# Patient Record
Sex: Female | Born: 1935 | Race: White | Hispanic: No | Smoking: Never smoker
Health system: Southern US, Community
[De-identification: ages and names within clinical notes are randomized; demographics above are authoritative.]

## PROBLEM LIST (undated history)

## (undated) DIAGNOSIS — S7290XA Unspecified fracture of unspecified femur, initial encounter for closed fracture: Secondary | ICD-10-CM

## (undated) DIAGNOSIS — F0390 Unspecified dementia without behavioral disturbance: Secondary | ICD-10-CM

## (undated) DIAGNOSIS — F419 Anxiety disorder, unspecified: Secondary | ICD-10-CM

## (undated) DIAGNOSIS — M1990 Unspecified osteoarthritis, unspecified site: Secondary | ICD-10-CM

## (undated) DIAGNOSIS — S5292XA Unspecified fracture of left forearm, initial encounter for closed fracture: Secondary | ICD-10-CM

## (undated) DIAGNOSIS — G4700 Insomnia, unspecified: Secondary | ICD-10-CM

## (undated) HISTORY — PX: CATARACT EXTRACTION: SUR2

---

## 2014-09-19 DIAGNOSIS — Z681 Body mass index (BMI) 19 or less, adult: Secondary | ICD-10-CM | POA: Diagnosis not present

## 2014-09-19 DIAGNOSIS — Z1389 Encounter for screening for other disorder: Secondary | ICD-10-CM | POA: Diagnosis not present

## 2014-09-19 DIAGNOSIS — F418 Other specified anxiety disorders: Secondary | ICD-10-CM | POA: Diagnosis not present

## 2014-09-19 DIAGNOSIS — R413 Other amnesia: Secondary | ICD-10-CM | POA: Diagnosis not present

## 2014-11-05 DIAGNOSIS — R634 Abnormal weight loss: Secondary | ICD-10-CM | POA: Diagnosis not present

## 2014-11-05 DIAGNOSIS — F419 Anxiety disorder, unspecified: Secondary | ICD-10-CM | POA: Diagnosis not present

## 2014-11-05 DIAGNOSIS — Z681 Body mass index (BMI) 19 or less, adult: Secondary | ICD-10-CM | POA: Diagnosis not present

## 2014-11-05 DIAGNOSIS — R413 Other amnesia: Secondary | ICD-10-CM | POA: Diagnosis not present

## 2015-04-09 DIAGNOSIS — Z0000 Encounter for general adult medical examination without abnormal findings: Secondary | ICD-10-CM | POA: Diagnosis not present

## 2015-04-09 DIAGNOSIS — R413 Other amnesia: Secondary | ICD-10-CM | POA: Diagnosis not present

## 2015-04-16 DIAGNOSIS — Z23 Encounter for immunization: Secondary | ICD-10-CM | POA: Diagnosis not present

## 2015-04-16 DIAGNOSIS — Z0000 Encounter for general adult medical examination without abnormal findings: Secondary | ICD-10-CM | POA: Diagnosis not present

## 2015-04-16 DIAGNOSIS — R8290 Unspecified abnormal findings in urine: Secondary | ICD-10-CM | POA: Diagnosis not present

## 2015-04-16 DIAGNOSIS — Z1389 Encounter for screening for other disorder: Secondary | ICD-10-CM | POA: Diagnosis not present

## 2015-04-16 DIAGNOSIS — F0390 Unspecified dementia without behavioral disturbance: Secondary | ICD-10-CM | POA: Diagnosis not present

## 2015-04-16 DIAGNOSIS — E43 Unspecified severe protein-calorie malnutrition: Secondary | ICD-10-CM | POA: Diagnosis not present

## 2015-04-16 DIAGNOSIS — F419 Anxiety disorder, unspecified: Secondary | ICD-10-CM | POA: Diagnosis not present

## 2015-09-30 DIAGNOSIS — S7290XA Unspecified fracture of unspecified femur, initial encounter for closed fracture: Secondary | ICD-10-CM

## 2015-09-30 DIAGNOSIS — S5292XA Unspecified fracture of left forearm, initial encounter for closed fracture: Secondary | ICD-10-CM

## 2015-09-30 HISTORY — DX: Unspecified fracture of unspecified femur, initial encounter for closed fracture: S72.90XA

## 2015-09-30 HISTORY — DX: Unspecified fracture of left forearm, initial encounter for closed fracture: S52.92XA

## 2015-10-01 ENCOUNTER — Emergency Department (HOSPITAL_COMMUNITY): Payer: Medicare Other

## 2015-10-01 ENCOUNTER — Inpatient Hospital Stay (HOSPITAL_COMMUNITY)
Admission: EM | Admit: 2015-10-01 | Discharge: 2015-10-07 | DRG: 480 | Disposition: A | Discharge status: Skilled Nursing Facility | Payer: Medicare Other | Attending: Family Medicine | Admitting: Family Medicine

## 2015-10-01 ENCOUNTER — Encounter (HOSPITAL_COMMUNITY): Payer: Self-pay | Admitting: Emergency Medicine

## 2015-10-01 ENCOUNTER — Inpatient Hospital Stay (HOSPITAL_COMMUNITY): Payer: Medicare Other

## 2015-10-01 DIAGNOSIS — S72002A Fracture of unspecified part of neck of left femur, initial encounter for closed fracture: Secondary | ICD-10-CM | POA: Diagnosis present

## 2015-10-01 DIAGNOSIS — D649 Anemia, unspecified: Secondary | ICD-10-CM | POA: Diagnosis not present

## 2015-10-01 DIAGNOSIS — S0990XA Unspecified injury of head, initial encounter: Secondary | ICD-10-CM | POA: Diagnosis not present

## 2015-10-01 DIAGNOSIS — R9431 Abnormal electrocardiogram [ECG] [EKG]: Secondary | ICD-10-CM | POA: Diagnosis not present

## 2015-10-01 DIAGNOSIS — S5292XA Unspecified fracture of left forearm, initial encounter for closed fracture: Secondary | ICD-10-CM | POA: Diagnosis present

## 2015-10-01 DIAGNOSIS — L89151 Pressure ulcer of sacral region, stage 1: Secondary | ICD-10-CM | POA: Diagnosis not present

## 2015-10-01 DIAGNOSIS — Z515 Encounter for palliative care: Secondary | ICD-10-CM | POA: Insufficient documentation

## 2015-10-01 DIAGNOSIS — R739 Hyperglycemia, unspecified: Secondary | ICD-10-CM | POA: Diagnosis present

## 2015-10-01 DIAGNOSIS — S40022A Contusion of left upper arm, initial encounter: Secondary | ICD-10-CM | POA: Diagnosis not present

## 2015-10-01 DIAGNOSIS — D6489 Other specified anemias: Secondary | ICD-10-CM | POA: Diagnosis not present

## 2015-10-01 DIAGNOSIS — M8580 Other specified disorders of bone density and structure, unspecified site: Secondary | ICD-10-CM | POA: Diagnosis present

## 2015-10-01 DIAGNOSIS — Y93K1 Activity, walking an animal: Secondary | ICD-10-CM | POA: Diagnosis not present

## 2015-10-01 DIAGNOSIS — S52572A Other intraarticular fracture of lower end of left radius, initial encounter for closed fracture: Secondary | ICD-10-CM | POA: Diagnosis not present

## 2015-10-01 DIAGNOSIS — E559 Vitamin D deficiency, unspecified: Secondary | ICD-10-CM | POA: Diagnosis present

## 2015-10-01 DIAGNOSIS — D72829 Elevated white blood cell count, unspecified: Secondary | ICD-10-CM | POA: Diagnosis present

## 2015-10-01 DIAGNOSIS — S72009A Fracture of unspecified part of neck of unspecified femur, initial encounter for closed fracture: Secondary | ICD-10-CM | POA: Diagnosis not present

## 2015-10-01 DIAGNOSIS — S72132A Displaced apophyseal fracture of left femur, initial encounter for closed fracture: Secondary | ICD-10-CM | POA: Diagnosis not present

## 2015-10-01 DIAGNOSIS — M25552 Pain in left hip: Secondary | ICD-10-CM | POA: Diagnosis not present

## 2015-10-01 DIAGNOSIS — Z419 Encounter for procedure for purposes other than remedying health state, unspecified: Secondary | ICD-10-CM

## 2015-10-01 DIAGNOSIS — S52562A Barton's fracture of left radius, initial encounter for closed fracture: Secondary | ICD-10-CM | POA: Diagnosis not present

## 2015-10-01 DIAGNOSIS — L8990 Pressure ulcer of unspecified site, unspecified stage: Secondary | ICD-10-CM | POA: Insufficient documentation

## 2015-10-01 DIAGNOSIS — S72142A Displaced intertrochanteric fracture of left femur, initial encounter for closed fracture: Secondary | ICD-10-CM | POA: Diagnosis not present

## 2015-10-01 DIAGNOSIS — E876 Hypokalemia: Secondary | ICD-10-CM | POA: Diagnosis not present

## 2015-10-01 DIAGNOSIS — W541XXA Struck by dog, initial encounter: Secondary | ICD-10-CM | POA: Diagnosis not present

## 2015-10-01 DIAGNOSIS — S52512A Displaced fracture of left radial styloid process, initial encounter for closed fracture: Secondary | ICD-10-CM | POA: Diagnosis not present

## 2015-10-01 DIAGNOSIS — Z66 Do not resuscitate: Secondary | ICD-10-CM | POA: Diagnosis present

## 2015-10-01 DIAGNOSIS — Z7189 Other specified counseling: Secondary | ICD-10-CM | POA: Diagnosis not present

## 2015-10-01 DIAGNOSIS — Z01810 Encounter for preprocedural cardiovascular examination: Secondary | ICD-10-CM

## 2015-10-01 DIAGNOSIS — S52502A Unspecified fracture of the lower end of left radius, initial encounter for closed fracture: Secondary | ICD-10-CM | POA: Diagnosis not present

## 2015-10-01 DIAGNOSIS — G308 Other Alzheimer's disease: Secondary | ICD-10-CM | POA: Diagnosis not present

## 2015-10-01 DIAGNOSIS — E43 Unspecified severe protein-calorie malnutrition: Secondary | ICD-10-CM | POA: Diagnosis present

## 2015-10-01 DIAGNOSIS — S59292A Other physeal fracture of lower end of radius, left arm, initial encounter for closed fracture: Secondary | ICD-10-CM | POA: Diagnosis not present

## 2015-10-01 DIAGNOSIS — E46 Unspecified protein-calorie malnutrition: Secondary | ICD-10-CM | POA: Diagnosis not present

## 2015-10-01 DIAGNOSIS — F0390 Unspecified dementia without behavioral disturbance: Secondary | ICD-10-CM | POA: Diagnosis not present

## 2015-10-01 DIAGNOSIS — W010XXA Fall on same level from slipping, tripping and stumbling without subsequent striking against object, initial encounter: Secondary | ICD-10-CM | POA: Diagnosis present

## 2015-10-01 DIAGNOSIS — S52532A Colles' fracture of left radius, initial encounter for closed fracture: Secondary | ICD-10-CM | POA: Diagnosis not present

## 2015-10-01 DIAGNOSIS — Z681 Body mass index (BMI) 19 or less, adult: Secondary | ICD-10-CM

## 2015-10-01 DIAGNOSIS — D62 Acute posthemorrhagic anemia: Secondary | ICD-10-CM | POA: Insufficient documentation

## 2015-10-01 DIAGNOSIS — M84758D Complete oblique atypical femoral fracture, left leg, subsequent encounter for fracture with routine healing: Secondary | ICD-10-CM | POA: Diagnosis not present

## 2015-10-01 DIAGNOSIS — S299XXA Unspecified injury of thorax, initial encounter: Secondary | ICD-10-CM | POA: Diagnosis not present

## 2015-10-01 HISTORY — DX: Unspecified osteoarthritis, unspecified site: M19.90

## 2015-10-01 HISTORY — DX: Unspecified fracture of left forearm, initial encounter for closed fracture: S52.92XA

## 2015-10-01 HISTORY — DX: Unspecified dementia, unspecified severity, without behavioral disturbance, psychotic disturbance, mood disturbance, and anxiety: F03.90

## 2015-10-01 HISTORY — DX: Insomnia, unspecified: G47.00

## 2015-10-01 HISTORY — DX: Anxiety disorder, unspecified: F41.9

## 2015-10-01 HISTORY — DX: Unspecified fracture of unspecified femur, initial encounter for closed fracture: S72.90XA

## 2015-10-01 LAB — BASIC METABOLIC PANEL
ANION GAP: 11 (ref 5–15)
BUN: 19 mg/dL (ref 6–20)
CO2: 25 mmol/L (ref 22–32)
Calcium: 8.4 mg/dL — ABNORMAL LOW (ref 8.9–10.3)
Chloride: 102 mmol/L (ref 101–111)
Creatinine, Ser: 0.52 mg/dL (ref 0.44–1.00)
GFR calc Af Amer: >60 mL/min (ref >60)
GFR calc non Af Amer: >60 mL/min (ref >60)
GLUCOSE: 129 mg/dL — AB (ref 65–99)
Potassium: 4.2 mmol/L (ref 3.5–5.1)
Sodium: 138 mmol/L (ref 135–145)

## 2015-10-01 LAB — CBC
HEMATOCRIT: 33.7 % — AB (ref 36.0–46.0)
Hemoglobin: 10.1 g/dL — ABNORMAL LOW (ref 12.0–15.0)
MCH: 26.5 pg (ref 26.0–34.0)
MCHC: 30 g/dL (ref 30.0–36.0)
MCV: 88.5 fL (ref 78.0–100.0)
Platelets: 368 10*3/uL (ref 150–400)
RBC: 3.81 MIL/uL — AB (ref 3.87–5.11)
RDW: 17.8 % — ABNORMAL HIGH (ref 11.5–15.5)
WBC: 18.9 10*3/uL — AB (ref 4.0–10.5)

## 2015-10-01 LAB — PROTIME-INR
INR: 1.15 (ref 0.00–1.49)
Prothrombin Time: 14.9 seconds (ref 11.6–15.2)

## 2015-10-01 LAB — ABO/RH: ABO/RH(D): A POS

## 2015-10-01 LAB — I-STAT CHEM 8, ED
BUN: 20 mg/dL (ref 6–20)
CHLORIDE: 101 mmol/L (ref 101–111)
Calcium, Ion: 0.98 mmol/L — ABNORMAL LOW (ref 1.12–1.23)
Creatinine, Ser: 0.5 mg/dL (ref 0.44–1.00)
GLUCOSE: 126 mg/dL — AB (ref 65–99)
HCT: 35 % — ABNORMAL LOW (ref 36.0–46.0)
Hemoglobin: 11.9 g/dL — ABNORMAL LOW (ref 12.0–15.0)
POTASSIUM: 4.2 mmol/L (ref 3.5–5.1)
Sodium: 138 mmol/L (ref 135–145)
TCO2: 25 mmol/L (ref 0–100)

## 2015-10-01 LAB — MRSA PCR SCREENING: MRSA BY PCR: NEGATIVE

## 2015-10-01 LAB — CBC AND DIFFERENTIAL
HCT: 34 % — AB (ref 36–46)
HEMOGLOBIN: 10.1 g/dL — AB (ref 12.0–16.0)
PLATELETS: 368 10*3/uL (ref 150–399)
WBC: 18.9 10*3/mL

## 2015-10-01 LAB — ALBUMIN: Albumin: 3.5 g/dL (ref 3.5–5.0)

## 2015-10-01 LAB — CALCIUM: CALCIUM: 8.2 mg/dL — AB (ref 8.9–10.3)

## 2015-10-01 MED ORDER — VITAMIN A 10000 UNITS PO CAPS: 10000.0000 [IU] | ORAL_CAPSULE | Freq: Every day | ORAL | Status: DC

## 2015-10-01 MED ORDER — HEPARIN SODIUM (PORCINE) 5000 UNIT/ML IJ SOLN
5000.0000 [IU] | Freq: Three times a day (TID) | INTRAMUSCULAR | Status: DC
  Administered 2015-10-01 – 2015-10-07 (×16): 5000 [IU] via SUBCUTANEOUS
  Filled 2015-10-01 (×14): qty 1

## 2015-10-01 MED ORDER — FERROUS SULFATE 325 (65 FE) MG PO TABS
325.0000 mg | ORAL_TABLET | Freq: Three times a day (TID) | ORAL | Status: DC
  Administered 2015-10-03 – 2015-10-07 (×14): 325 mg via ORAL
  Filled 2015-10-01 (×15): qty 1

## 2015-10-01 MED ORDER — MORPHINE SULFATE (PF) 2 MG/ML IV SOLN: 0.5000 mg | INTRAVENOUS | Status: DC | PRN

## 2015-10-01 MED ORDER — VITAMIN D 1000 UNITS PO TABS
1000.0000 [IU] | ORAL_TABLET | Freq: Every day | ORAL | Status: DC
Start: 2015-10-01 — End: 2015-10-07
  Administered 2015-10-03 – 2015-10-07 (×5): 1000 [IU] via ORAL
  Filled 2015-10-01 (×7): qty 1

## 2015-10-01 MED ORDER — ADULT MULTIVITAMIN W/MINERALS CH
1.0000 | ORAL_TABLET | Freq: Every day | ORAL | Status: DC
  Administered 2015-10-01 – 2015-10-07 (×6): 1 via ORAL
  Filled 2015-10-01 (×8): qty 1

## 2015-10-01 MED ORDER — DONEPEZIL HCL 10 MG PO TABS
10.0000 mg | ORAL_TABLET | Freq: Every day | ORAL | Status: DC
  Administered 2015-10-01 – 2015-10-06 (×6): 10 mg via ORAL
  Filled 2015-10-01 (×6): qty 1

## 2015-10-01 MED ORDER — HALOPERIDOL LACTATE 5 MG/ML IJ SOLN: 5.0000 mg | Freq: Every evening | INTRAMUSCULAR | Status: DC | PRN

## 2015-10-01 MED ORDER — ENSURE ENLIVE PO LIQD
237.0000 mL | Freq: Two times a day (BID) | ORAL | Status: DC
  Administered 2015-10-01 – 2015-10-07 (×10): 237 mL via ORAL

## 2015-10-01 MED ORDER — LORAZEPAM 2 MG/ML IJ SOLN: 0.0500 mg | Freq: Three times a day (TID) | INTRAMUSCULAR | Status: DC | PRN

## 2015-10-01 MED ORDER — HYDROMORPHONE HCL 1 MG/ML IJ SOLN
0.5000 mg | INTRAMUSCULAR | Status: DC | PRN
  Administered 2015-10-01 – 2015-10-04 (×4): 0.5 mg via INTRAVENOUS
  Filled 2015-10-01 (×5): qty 1

## 2015-10-01 MED ORDER — DOCUSATE SODIUM 100 MG PO CAPS
100.0000 mg | ORAL_CAPSULE | Freq: Two times a day (BID) | ORAL | Status: DC
  Administered 2015-10-01 – 2015-10-07 (×11): 100 mg via ORAL
  Filled 2015-10-01 (×12): qty 1

## 2015-10-01 NOTE — ED Provider Notes (Signed)
CSN: [id number]     Arrival date & time 10/01/15  1047 History   First MD Initiated Contact with Patient 10/01/15 1242     Chief Complaint  Patient presents with  . Fall  . Hip Pain     (Consider location/radiation/quality/duration/timing/severity/associated sxs/prior Treatment) HPI Comments: 82 year old female presents for left arm bruising and hip pain. The patient reportedly was with her daughter last night and he went to visit a friend. The dog ran into the patient's legs causing her to fall. The patient did not complain of much pain last night but was not able to stand on her left leg. Today they noted that there was bruising in her left forearm and she started to complain of pain in the area. The patient's daughter called the primary care physician's office who recommended that the patient be brought in for evaluation. The patient also reportedly hit her head but did not lose consciousness. The patient is not on any blood thinners and per her daughter just takes medication for anxiety every once in a while.   History reviewed. No pertinent past medical history. History reviewed. No pertinent past surgical history. No family history on file. Social History  Substance Use Topics  . Smoking status: Never Smoker   . Smokeless tobacco: None  . Alcohol Use: No   OB History    No data available     Review of Systems  Constitutional: Negative for fever, fatigue and unexpected weight change.  HENT: Negative for congestion and postnasal drip.   Eyes: Negative for visual disturbance.  Respiratory: Negative for cough, chest tightness and shortness of breath.   Cardiovascular: Negative for chest pain and palpitations.  Gastrointestinal: Negative for nausea, vomiting, abdominal pain and diarrhea.  Genitourinary: Negative for dysuria and hematuria.  Musculoskeletal: Positive for arthralgias (left hip and left forearm pain) and gait problem. Negative for back pain.  Skin: Positive for color  change (bruising over the left forearm). Negative for rash.  Neurological: Negative for dizziness, syncope and headaches.  Hematological: Does not bruise/bleed easily.      Allergies  Review of patient's allergies indicates no known allergies.  Home Medications   Prior to Admission medications   Medication Sig Start Date End Date Taking? Authorizing Provider  cholecalciferol (VITAMIN D) 1000 units tablet Take 1,000 Units by mouth daily.   Yes Historical Provider, MD  Cyanocobalamin (VITAMIN B 12 PO) Take 1 tablet by mouth daily.   Yes Historical Provider, MD  donepezil (ARICEPT) 10 MG tablet Take 10 mg by mouth at bedtime. Pt's daughter says she is not taking this anymore   Yes Historical Provider, MD  ferrous sulfate 325 (65 FE) MG tablet Take 325 mg by mouth daily with breakfast.   Yes Historical Provider, MD  Multiple Vitamins-Minerals (MULTIVITAMIN WITH MINERALS) tablet Take 1 tablet by mouth daily.   Yes Historical Provider, MD  vitamin A 2585210000 UNIT capsule Take 10,000 Units by mouth daily.   Yes Historical Provider, MD   BP 116/58 mmHg  Pulse 107  Temp(Src) 98.5 F (36.9 C) (Oral)  Resp 19  SpO2 100% Physical Exam  Constitutional: She is oriented to person, place, and time. She appears well-developed. She appears cachectic. No distress.  HENT:  Head: Normocephalic and atraumatic.  Right Ear: External ear normal.  Left Ear: External ear normal.  Nose: Nose normal.  Mouth/Throat: Oropharynx is clear and moist. No oropharyngeal exudate.  Eyes: EOM are normal. Pupils are equal, round, and reactive to light.  Neck:  Normal range of motion. Neck supple.  Cardiovascular: Normal rate, regular rhythm, normal heart sounds and intact distal pulses.   No murmur heard. Pulses:      Dorsalis pedis pulses are 2+ on the right side, and 2+ on the left side.  Pulmonary/Chest: Effort normal. No respiratory distress. She has no wheezes. She has no rales.  Abdominal: Soft. She exhibits no  distension. There is no tenderness.  Musculoskeletal: She exhibits no edema.       Left shoulder: Normal.       Left hip: She exhibits decreased range of motion, tenderness, bony tenderness and deformity. She exhibits no swelling.       Arms:      Legs: Neurological: She is alert and oriented to person, place, and time.  Skin: Skin is warm and dry. No rash noted. She is not diaphoretic.  Vitals reviewed.   ED Course  Procedures (including critical care time) Labs Review Labs Reviewed  CBC - Abnormal; Notable for the following:    WBC 18.9 (*)    RBC 3.81 (*)    Hemoglobin 10.1 (*)    HCT 33.7 (*)    RDW 17.8 (*)    All other components within normal limits  BASIC METABOLIC PANEL - Abnormal; Notable for the following:    Glucose, Bld 129 (*)    Calcium 8.4 (*)    All other components within normal limits  CALCIUM - Abnormal; Notable for the following:    Calcium 8.2 (*)    All other components within normal limits  I-STAT CHEM 8, ED - Abnormal; Notable for the following:    Glucose, Bld 126 (*)    Calcium, Ion 0.98 (*)    Hemoglobin 11.9 (*)    HCT 35.0 (*)    All other components within normal limits  URINE CULTURE  MRSA PCR SCREENING  PROTIME-INR  ALBUMIN  VITAMIN D 25 HYDROXY (VIT D DEFICIENCY, FRACTURES)  URINALYSIS, ROUTINE W REFLEX MICROSCOPIC (NOT AT [facility])  TYPE AND SCREEN  ABO/RH    Imaging Review Dg Chest 2 View  10/01/2015  CLINICAL DATA:  Fall yesterday walking the dog EXAM: CHEST  2 VIEW COMPARISON:  None. FINDINGS: Borderline cardiomegaly. Hyperinflation is noted. No infiltrate or pulmonary edema. Thoracic spine osteopenia. No gross fractures are noted. No pneumothorax. IMPRESSION: No active disease. Hyperinflation. Cardiomegaly. Thoracic spine osteopenia. No gross fractures. No pneumothorax. Electronically Signed   By: Natasha Mead M.D.   On: 10/01/2015 14:00   Dg Wrist Complete Left  10/01/2015  CLINICAL DATA:  Status post fall a few days ago with a left  wrist injury. Pain and swelling. Initial encounter. EXAM: LEFT WRIST - COMPLETE 3+ VIEW COMPARISON:  None. FINDINGS: The patient has a fracture of the distal radius with intra-articular involvement. There is impaction of the volar aspect of the radius above 0.6 cm. The fracture appears mildly comminuted. No other acute bony or joint abnormality is seen. Bones are osteopenic. Atherosclerosis is noted. Soft tissue swelling is present about the wrist. First CMC and scaphoid trapezium trapezoid joint osteoarthritis is noted. IMPRESSION: Impacted distal radius fracture involves articular surface. Osteopenia. Atherosclerosis. Electronically Signed   By: Drusilla Kanner M.D.   On: 10/01/2015 12:20   Ct Head Wo Contrast  10/01/2015  CLINICAL DATA:  82 year old female with a history of fall EXAM: CT HEAD WITHOUT CONTRAST TECHNIQUE: Contiguous axial images were obtained from the base of the skull through the vertex without intravenous contrast. COMPARISON:  None. FINDINGS: Unremarkable  appearance of the calvarium without acute fracture or aggressive lesion. Unremarkable appearance of the scalp soft tissues. Unremarkable appearance of the bilateral orbits. Mastoid air cells are clear. No significant paranasal sinus disease No acute intracranial hemorrhage. No midline shift or mass effect. Gray-white differentiation relatively maintained. Confluent hypodensity in the periventricular white matter. Mild brain volume loss. Suggesting calcifications. IMPRESSION: No CT evidence of acute intracranial abnormality. Senescent brain volume loss and chronic white matter disease. Signed, Yvone Neu. Loreta Ave, DO [facility] Electronically Signed   By: Gilmer Mor D.O.   On: 10/01/2015 13:44   Dg Hip Unilat With Pelvis 2-3 Views Left  10/01/2015  CLINICAL DATA:  82 year old female with a history of fall a few days prior. Ongoing hip pain EXAM: DG HIP (WITH OR WITHOUT PELVIS)  2-3V LEFT COMPARISON:  None. FINDINGS: Diffuse osteopenia. Acute fracture of the left femoral neck, with proximal migration of the femur fracture fragment. Bony pelvic ring appears intact with no pelvic fracture identified. Degenerative changes of the lower lumbar spine. Degenerative changes the bilateral hips. Extensive vascular calcifications. IMPRESSION: Acute left femoral neck fracture with angulation at the fracture site and proximal migration of the distal fracture fragment. Atherosclerosis Signed, Yvone Neu. Loreta Ave, DO [facility] Electronically Signed   By: Gilmer Mor D.O.   On: 10/01/2015 12:25   I have personally reviewed and evaluated these images and lab results as part of my medical decision-making.   EKG Interpretation   Date/Time:  Tuesday October 01 2015 13:00:22 EDT Ventricular Rate:  96 PR Interval:    QRS Duration: 81 QT Interval:  393 QTC Calculation: 497 R Axis:   -11 Text Interpretation:  Sinus rhythm Probable left atrial enlargement RSR'  in V1 or V2, probably normal variant Left ventricular hypertrophy  Borderline prolonged QT interval Baseline wander in lead(s) V5 No previous  ECGs available Confirmed by NGUYEN, EMILY ([id number]) on 10/01/2015 1:15:35 PM  Also confirmed by Cyndie Chime, EMILY ([id number]), editor Dan Humphreys, CCT, SANDRA  ([id number])  on 10/01/2015 1:27:25 PM      MDM  Patient was seen and evaluated in stable condition. Patient sustained a fall last night. X-rays revealed proximal femur fracture with angulation and proximal migration as well as an impacted distal radius fracture on the left. Orthopedics was called. Per OR nurse the images were reviewed by Dr. August Saucer. He recommended splinting the patient's arm. Patient was placed in an arm splint the emergency department. He said that the patient did not need to be nothing by mouth at this time. Case was discussed with internal medicine team who agreed with admission.  Patient was admitted under their care with orthopedics following. Patient and daughter were updated on results and plan of care. Final diagnoses:  Left displaced femoral neck fracture, closed, initial encounter (HCC)    1. Left femoral neck fracture  2. Left radial fracture    Leta Baptist, MD 10/01/15 [phone]

## 2015-10-01 NOTE — Progress Notes (Signed)
   10/01/15 1835  Vitals  BP 125/72 mmHg  MAP (mmHg) 85  BP Location Right Arm  BP Method Automatic  Patient Position (if appropriate) Lying  Pulse Rate (!) 108  Pulse Rate Source Dinamap  Resp 20  Oxygen Therapy  SpO2 100 %  O2 Device Room Air  Pain Assessment  Pain Assessment No/denies pain  Pain Score 0  PCA/Epidural/Spinal Assessment  Respiratory Pattern Regular;Unlabored  Neurological  Neuro (WDL) X  Level of Consciousness Alert  Orientation Level Oriented to person;Oriented to place;Disoriented to time;Oriented to situation  Cognition Memory impairment;Poor safety awareness;Poor judgement;Poor attention/concentration;Impulsive  Speech Clear  Pupil Assessment  Yes  R Pupil Size (mm) 2  R Pupil Shape Round  R Pupil Reaction Brisk  L Pupil Size (mm) 2  L Pupil Shape Round  L Pupil Reaction Brisk  Additional Pupil Assessments No  Neuro Symptoms Anxiety  Neuro symptoms relieved by (md placed orders)  Musculoskeletal  Musculoskeletal (WDL) X  Generalized Weakness Yes  Musculoskeletal Details  RUE Full movement  LUE Limited movement;Swelling;Deformity;Injury/trauma  RLE Full movement  LLE Limited movement;Swelling;Injury/trauma;Deformity;Weakness  Integumentary  Skin Condition Dry (no change from previous assessment)  Post Fall Assessment  Patient placed on bedpan with bed alarm on. Bed alarm went off and nurse tech ran to room and found patient's buttock on floor and appeared to slide down side of bed attempted to get up. Patient's head did not touch floor. Patient sitting at edge with back upright against bed. Patient stated she was trying to go to bathroom even though she was on the bedpan prior to fall. RN and NT assisted patient back to bed.  Patient has hx of dementia. Patient denies pain at this time. Denies neck tenderness. Assessed sensation and mobility which was unchanged since admission. Agitation and anxiety when unaccompained. While staff at bedside  patient is calm and follows commands. MD placed orders for safety sitter and PRN IV medications. Daughter aware and stated she will come see patient tonight.

## 2015-10-01 NOTE — Progress Notes (Signed)
Orthopedic Tech Progress Note Patient Details:  Kristen Clark 04/03/1929 [id number]  Ortho Devices Type of Ortho Device: Arm sling, Ace wrap, Sugartong splint Ortho Device/Splint Interventions: Application   Saul FordyceJennifer C [name] 10/01/2015, 3:39 PM

## 2015-10-01 NOTE — Progress Notes (Signed)
Left wrist and left hip fracture - both look operative Echo in progress Plan for surgery tomorrow as pt ate at 4 p today Discussed with daughter

## 2015-10-01 NOTE — Progress Notes (Signed)
[place] to [phone]

## 2015-10-01 NOTE — Progress Notes (Signed)
Verbal order from dr. August Saucerean to keep pt NPO until he assesses patient for possible OR procedure tonight. MD made aware that patient has had a few sips of strawberry ensure and last meal with this morning for breakfast.

## 2015-10-01 NOTE — Progress Notes (Signed)
Kristen Clark is a 82 y.o. female patient admitted from ED awake, alert - oriented  X 3 - no acute distress noted.  VSS - Blood pressure 116/58, pulse 107, temperature 98.5 F (36.9 C), temperature source Oral, resp. rate 19, SpO2 100 %.    IV in place, occlusive dsg intact without redness.  Orientation to room, and floor completed with information packet given to patient/family.  Patient declined safety video at this time.  Admission INP armband ID verified with patient/family, and in place.   SR up x 2, fall assessment complete, with patient and family able to verbalize understanding of risk associated with falls, and verbalized understanding to call nsg before up out of bed.  Call light within reach, patient able to voice, and demonstrate understanding.  Splint on left forearm.    Will cont to eval and treat per MD orders.  Kendall FlackL'ESPERANCE, Temple Sporer C, RN 10/01/2015 4:36 PM

## 2015-10-01 NOTE — Consult Note (Signed)
Reason for Consult: Left hip and left wrist pain Referring Physician: .  Dr. Carmelia RollerMerrell  Kristen Clark is an 82 y.o. female.  HPI: Kristen Clark  May is a 82 year old female with left hip and left wrist pain.  She injured herself while at her daughter's house.  She had a mechanical fall and landed on the left hip and left wrist.  Denies any loss of consciousness and denies any other orthopedic complaints she is generally ambulatory around the house.    Past Medical History  Diagnosis Date  . Femoral fracture (HCC) 09/30/2015    Fracture of femoral neck, left after mechanical fall  . Left radial fracture 09/30/2015    after mechanical fall  . Insomnia   . Arthritis     "generalized" (10/01/2015)  . Anxiety   . Dementia     "aggressive" (10/01/2015)    Past Surgical History  Procedure Laterality Date  . Cataract extraction Bilateral early 2000s    History reviewed. No pertinent family history.  Social History:  reports that she has never smoked. She has never used smokeless tobacco. She reports that she does not drink alcohol or use illicit drugs.  Allergies: No Known Allergies  Medications: I have reviewed the patient's current medications.  Results for orders placed or performed during the hospital encounter of 10/01/15 (from the past 48 hour(s))  CBC     Status: Abnormal   Collection Time: 10/01/15 12:44 PM  Result Value Ref Range   WBC 18.9 (H) 4.0 - 10.5 K/uL  I have reviewed the patient's current medications. 10/01/2015, 7:57 PM

## 2015-10-01 NOTE — Progress Notes (Signed)
  Echocardiogram 2D Echocardiogram has been performed.  Kristen Clark, Kristen Clark 10/01/2015, 5:55 PM

## 2015-10-01 NOTE — ED Notes (Signed)
Re-paged x2 to Dr. August Saucerean to [phone]

## 2015-10-01 NOTE — ED Notes (Signed)
Pt states she was walking yesterday and got knocked over by a dog and fell onto her left side. Pt c/o of left hip pain and left wrist pain. Pt has +3 equal radial pulses and +2 equal pedal pulses. ;eft leg does appear to be shortened with pt sitting in wheelchair. Pt has redness and swelling to left wrist.

## 2015-10-01 NOTE — H&P (Signed)
History and Physical    Akili Cuda QIO:[id number] DOB: 1929-06-16 DOA: 10/01/2015   PCP: Velna Hatchet, MD   Patient coming from/Resides with: Private residence/lives with daughter  Chief Complaint: Left hip and left wrist pain after mechanical fall on 7/10  HPI: Kristen Clark is a 82 y.o. female with medical history significant for dementia on Aricept, significant protein calorie malnutrition with continued weight loss, vitamin D deficiency and osteopenia who presents to the ER after experiencing mechanical fall yesterday. Patient had been visiting a family friend along with her daughter when the family friend's dog rushed the patient knocking her legs out from under her and she fell on her left side. She was having some pain but not significant enough to warrant the [facility] medical attention. Unfortunately after waking up sweating the pain was very severe and patient decided to seek medical treatment.   ED Course:  Vital signs: PO temp 98.3-BP 120/59-pulse 94-respirations 20-RA saturations 95% X-ray left hip: Acute left femoral neck fracture with angulation at the fracture site and proximal aggression of the distal fracture fragment Left wrist x-ray: Impacted distal radius fracture involving the articular surface CT head without contrast: No evidence of acute intracranial normality, senescent brain volume loss and chronic white matter disease 2 view chest x-ray: No active disease, hyperinflation and cardiomegaly Lab data: Room 138, potassium 4.2, CO2 25, BUN 19, creatinine 0.52, glucose 129, white count 18,900 differential not obtained, hemoglobin 10.1, platelets 368,000, PT 14.9, INR 1.15 Medications and treatments: Dilaudid 0.5 mg IV 1  Review of Systems:  In addition to the HPI above,  No Fever-chills, myalgias or other constitutional symptoms No Headache, changes with Vision or hearing, new weakness, tingling, numbness in any extremity, No problems swallowing food or Liquids,  indigestion/reflux No Chest pain, Cough or Shortness of Breath, palpitations, orthopnea or DOE No Abdominal pain, N/V; no melena or hematochezia, no dark tarry stools, Bowel movements are regular, No dysuria, hematuria or flank pain No new skin rashes, lesions, masses or bruises, No recent weight gain -Weight loss of 6 pounds in the past 6 months with known progressive weight loss over several years No polyuria, polydypsia or polyphagia,  PMH: Dementia Vitamin D deficiency Anemia on iron replacement Osteopenia Weight loss and associated severe protein calorie malnutrition  History reviewed. No pertinent past surgical history.  Social History   Social History  . Marital Status: Single    Spouse Name: N/A  . Number of Children: N/A  . Years of Education: N/A   Occupational History  . Not on file.   Social History Main Topics  . Smoking status: Never Smoker   . Smokeless tobacco: Not on file  . Alcohol Use: No  . Drug Use: Not on file  . Sexual Activity: Not on file   Other Topics Concern  . Not on file   Social History Narrative  . No narrative on file    Mobility: Without assistive devices prior to admission Work history: Not obtained   No Known Allergies  Family history reviewed and not pertinent admitting diagnosis  Prior to Admission medications   Medication Sig Start Date End Date Taking? Authorizing Provider  cholecalciferol (VITAMIN D) 1000 units tablet Take 1,000 Units by mouth daily.   Yes Historical Provider, MD  Cyanocobalamin (VITAMIN B 12 PO) Take 1 tablet by mouth daily.   Yes Historical Provider, MD  donepezil (ARICEPT) 10 MG tablet Take 10 mg by mouth at bedtime.   Yes Historical Provider, MD  ferrous sulfate  325 (65 FE) MG tablet Take 325 mg by mouth daily with breakfast.   Yes Historical Provider, MD  Multiple Vitamins-Minerals (MULTIVITAMIN WITH MINERALS) tablet Take 1 tablet by mouth daily.   Yes Historical Provider, MD  vitamin A 10000 UNIT  capsule Take 10,000 Units by mouth daily.   Yes Historical Provider, MD    Physical Exam: Filed Vitals:   10/01/15 1231 10/01/15 1415 10/01/15 1445 10/01/15 1515  BP: 130/66 110/65 114/64 108/69  Pulse: 101 108 109 109  Temp:      TempSrc:      Resp:  20 21 18   SpO2: 93% 93% 93% 91%      Constitutional: NAD, calm, comfortable-Appears quite undernourished cachectic Eyes: PERRL, lids and conjunctivae normal ENMT: Mucous membranes are moist. Posterior pharynx clear of any exudate or lesions.Normal dentition.  Neck: normal, supple, no masses, no thyromegaly Respiratory: clear to auscultation bilaterally, no wheezing, no crackles. Normal respiratory effort. No accessory muscle use. Ribs easily visualized beneath skin Cardiovascular: Regular rate and rhythm, no murmurs / rubs / gallops. No extremity edema. 2+ pedal pulses. No carotid bruits.  Abdomen: no tenderness, no masses palpated. No hepatosplenomegaly. Bowel sounds positive.  Musculoskeletal: no clubbing / cyanosis. Area of subtle joint deformity in left forearm with associated contusion and small abrasion, bruising left hip with tenderness to palpation noted, Good ROM unaffected right side, no contractures. Normal muscle tone.  Skin: no rashes, lesions, ulcers. No induration Neurologic: CN 2-12 grossly intact. Sensation intact, DTR normal. Strength 5/5 x all 4 extremities.  Psychiatric: Alert and oriented x name only. Mavik and short-term memory deficits observed. Normal mood.    Labs on Admission: I have personally reviewed following labs and imaging studies  CBC:  Recent Labs Lab 10/01/15 1244 10/01/15 1258  WBC 18.9*  --   HGB 10.1* 11.9*  HCT 33.7* 35.0*  MCV 88.5  --   PLT 368  --    Basic Metabolic Panel:  Recent Labs Lab 10/01/15 1244 10/01/15 1258  NA 138 138  K 4.2 4.2  CL 102 101  CO2 25  --   GLUCOSE 129* 126*  BUN 19 20  CREATININE 0.52 0.50  CALCIUM 8.4*  --    GFR: CrCl cannot be calculated  (Unknown ideal weight.). Liver Function Tests: No results for input(s): AST, ALT, ALKPHOS, BILITOT, PROT, ALBUMIN in the last 168 hours. No results for input(s): LIPASE, AMYLASE in the last 168 hours. No results for input(s): AMMONIA in the last 168 hours. Coagulation Profile:  Recent Labs Lab 10/01/15 1244  INR 1.15   Cardiac Enzymes: No results for input(s): CKTOTAL, CKMB, CKMBINDEX, TROPONINI in the last 168 hours. BNP (last 3 results) No results for input(s): PROBNP in the last 8760 hours. HbA1C: No results for input(s): HGBA1C in the last 72 hours. CBG: No results for input(s): GLUCAP in the last 168 hours. Lipid Profile: No results for input(s): CHOL, HDL, LDLCALC, TRIG, CHOLHDL, LDLDIRECT in the last 72 hours. Thyroid Function Tests: No results for input(s): TSH, T4TOTAL, FREET4, T3FREE, THYROIDAB in the last 72 hours. Anemia Panel: No results for input(s): VITAMINB12, FOLATE, FERRITIN, TIBC, IRON, RETICCTPCT in the last 72 hours. Urine analysis: No results found for: COLORURINE, APPEARANCEUR, LABSPEC, PHURINE, GLUCOSEU, HGBUR, BILIRUBINUR, KETONESUR, PROTEINUR, UROBILINOGEN, NITRITE, LEUKOCYTESUR Sepsis Labs: @LABRCNTIP (procalcitonin:4,lacticidven:4) )No results found for this or any previous visit (from the past 240 hour(s)).   Radiological Exams on Admission: Dg Chest 2 View  10/01/2015  CLINICAL DATA:  Fall yesterday walking the  dog EXAM: CHEST  2 VIEW COMPARISON:  None. FINDINGS: Borderline cardiomegaly. Hyperinflation is noted. No infiltrate or pulmonary edema. Thoracic spine osteopenia. No gross fractures are noted. No pneumothorax. IMPRESSION: No active disease. Hyperinflation. Cardiomegaly. Thoracic spine osteopenia. No gross fractures. No pneumothorax. Electronically Signed   By: Lahoma Crocker M.D.   On: 10/01/2015 14:00   Dg Wrist Complete Left  10/01/2015  CLINICAL DATA:  Status post fall a few days ago with a left wrist injury. Pain and swelling. Initial  encounter. EXAM: LEFT WRIST - COMPLETE 3+ VIEW COMPARISON:  None. FINDINGS: The patient has a fracture of the distal radius with intra-articular involvement. There is impaction of the volar aspect of the radius above 0.6 cm. The fracture appears mildly comminuted. No other acute bony or joint abnormality is seen. Bones are osteopenic. Atherosclerosis is noted. Soft tissue swelling is present about the wrist. First CMC and scaphoid trapezium trapezoid joint osteoarthritis is noted. IMPRESSION: Impacted distal radius fracture involves articular surface. Osteopenia. Atherosclerosis. Electronically Signed   By: Inge Rise M.D.   On: 10/01/2015 12:20   Ct Head Wo Contrast  10/01/2015  CLINICAL DATA:  82 year old female with a history of fall EXAM: CT HEAD WITHOUT CONTRAST TECHNIQUE: Contiguous axial images were obtained from the base of the skull through the vertex without intravenous contrast. COMPARISON:  None. FINDINGS: Unremarkable appearance of the calvarium without acute fracture or aggressive lesion. Unremarkable appearance of the scalp soft tissues. Unremarkable appearance of the bilateral orbits. Mastoid air cells are clear. No significant paranasal sinus disease No acute intracranial hemorrhage. No midline shift or mass effect. Gray-white differentiation relatively maintained. Confluent hypodensity in the periventricular white matter. Mild brain volume loss. Suggesting calcifications. IMPRESSION: No CT evidence of acute intracranial abnormality. Senescent brain volume loss and chronic white matter disease. Signed, Dulcy Fanny. Earleen Newport, DO [facility] Electronically Signed   By: Corrie Mckusick D.O.   On: 10/01/2015 13:44   Dg Hip Unilat With Pelvis 2-3 Views Left  10/01/2015  CLINICAL DATA:  82 year old female with a history of fall a few days prior. Ongoing hip pain EXAM: DG HIP (WITH OR WITHOUT PELVIS) 2-3V LEFT COMPARISON:  None. FINDINGS:  Diffuse osteopenia. Acute fracture of the left femoral neck, with proximal migration of the femur fracture fragment. Bony pelvic ring appears intact with no pelvic fracture identified. Degenerative changes of the lower lumbar spine. Degenerative changes the bilateral hips. Extensive vascular calcifications. IMPRESSION: Acute left femoral neck fracture with angulation at the fracture site and proximal migration of the distal fracture fragment. Atherosclerosis Signed, Dulcy Fanny. Earleen Newport, DO [facility] Rad[facility] Electronically Signed   By: Corrie Mckusick D.O.   On: 10/01/2015 12:25    EKG: (Independently reviewed) sinus rhythm with ventricular rate 96 eats per minute, QTC 497 ms, prominent S waves as well as prominent R waves in V2 leads concerning for LVH-no prior EKG for comparison  Assessment/Plan Principal Problem:   Fracture of femoral neck, left after mechanical fall -Comminuted hip fracture and patient with witnessed mechanical fall and known osteopenia and vitamin D deficiency -Orthopedic surgery plans operative intervention on 7/12 -NPO after midnight -I have instituted routine hip fracture orders: Check calcium and vitamin D level, oral and IV narcotics for pain, nonweightbearing-additional hip fracture related orders at discretion of surgical team -Discharge disposition will be pending postoperative response to PT/OT  Active Problems:   Left radial fracture -Splint applied in the ER at direction of  orthopedic team    Abnormal ECG -Voltage criteria met for LVH and there was a question of cardiomegaly on the chest x-ray -For completeness of exam will check echocardiogram preoperatively    Acute hyperglycemia/leukocytosis -Likely related to acute stressors of pain and injury -Repeat lab in a.m. -Check urinalysis and culture    Osteopenia/Vitamin D deficiency -She was on vitamin D as well as calcium supplementation prior to the  patient -Follow up on calcium, albumin and vitamin D labs obtained at admission    Dementia -Continue preadmission Aricept    Severe protein-calorie malnutrition [name]: less than 60% of standard weight)  -Chronic ongoing problem and likely related to patient's advancing dementia -Was taking Ensure Plus one time daily at home -Continue nutritional supplements but increase to twice a day while acutely stressed for surgery and hospitalization -Attrition consultation    Normocytic anemia -Baseline hemoglobin unknown -On iron supplementation prior to admission -Anemia panel      DVT prophylaxis: Subcutaneous heparin  Code Status: DO NOT RESUSCITATE Family Communication: Daughter Moraima Burd who is also the power of attorney and wishes to be contacted regarding any decisions Disposition Plan: Anticipate discharge back to preadmission home environment pending postoperative PT/OT evaluation Consults called: Orthopedics/Dr. Marlou Sa Admission status: Inpatient/medical floor    Warrene Kapfer L. ANP-BC [facility] Pager [phone]   If 7PM-7AM, please contact night-coverage [email or web address] Password [facility]  10/01/2015, 3:53 PM

## 2015-10-01 NOTE — ED Notes (Signed)
Paged ortho tech for forearm splint

## 2015-10-01 NOTE — Progress Notes (Signed)
Attempted to insert urethral catheter one time and was unsuccessful. Patient prior to insertion stated she had to go "pee and poop bad". Patient refusing RN to try again at this time.

## 2015-10-02 ENCOUNTER — Encounter (HOSPITAL_COMMUNITY)
Admission: EM | Disposition: A | Discharge status: Skilled Nursing Facility | Payer: Self-pay | Source: Home / Self Care | Attending: Family Medicine

## 2015-10-02 ENCOUNTER — Inpatient Hospital Stay (HOSPITAL_COMMUNITY): Payer: Medicare Other

## 2015-10-02 ENCOUNTER — Inpatient Hospital Stay (HOSPITAL_COMMUNITY): Payer: Medicare Other | Admitting: Certified Registered"

## 2015-10-02 DIAGNOSIS — Z515 Encounter for palliative care: Secondary | ICD-10-CM | POA: Insufficient documentation

## 2015-10-02 DIAGNOSIS — S5292XA Unspecified fracture of left forearm, initial encounter for closed fracture: Secondary | ICD-10-CM

## 2015-10-02 DIAGNOSIS — F0390 Unspecified dementia without behavioral disturbance: Secondary | ICD-10-CM

## 2015-10-02 DIAGNOSIS — Z7189 Other specified counseling: Secondary | ICD-10-CM

## 2015-10-02 DIAGNOSIS — S72002A Fracture of unspecified part of neck of left femur, initial encounter for closed fracture: Secondary | ICD-10-CM

## 2015-10-02 DIAGNOSIS — S72009A Fracture of unspecified part of neck of unspecified femur, initial encounter for closed fracture: Secondary | ICD-10-CM | POA: Diagnosis present

## 2015-10-02 DIAGNOSIS — L8990 Pressure ulcer of unspecified site, unspecified stage: Secondary | ICD-10-CM | POA: Insufficient documentation

## 2015-10-02 HISTORY — PX: ORIF WRIST FRACTURE: SHX2133

## 2015-10-02 HISTORY — PX: FEMUR IM NAIL: SHX1597

## 2015-10-02 LAB — BASIC METABOLIC PANEL
ANION GAP: 9 (ref 5–15)
BUN: 24 mg/dL — AB (ref 4–21)
BUN: 24 mg/dL — AB (ref 6–20)
CALCIUM: 8.3 mg/dL — AB (ref 8.9–10.3)
CO2: 27 mmol/L (ref 22–32)
Chloride: 103 mmol/L (ref 101–111)
Creatinine, Ser: 0.6 mg/dL (ref 0.44–1.00)
Creatinine: 0.6 mg/dL (ref 0.5–1.1)
GFR calc Af Amer: >60 mL/min (ref >60)
GLUCOSE: 107 mg/dL
GLUCOSE: 107 mg/dL — AB (ref 65–99)
POTASSIUM: 4.1 mmol/L (ref 3.5–5.1)
Potassium: 4.1 mmol/L (ref 3.4–5.3)
SODIUM: 139 mmol/L (ref 137–147)
SODIUM: 139 mmol/L (ref 135–145)

## 2015-10-02 LAB — URINE MICROSCOPIC-ADD ON

## 2015-10-02 LAB — IRON AND TIBC
Iron: 8 ug/dL — ABNORMAL LOW (ref 28–170)
Saturation Ratios: 3 % — ABNORMAL LOW (ref 10.4–31.8)
TIBC: 316 ug/dL (ref 250–450)
UIBC: 308 ug/dL

## 2015-10-02 LAB — ECHOCARDIOGRAM COMPLETE
EERAT: 7.35
EWDT: 136 ms
FS: 37 % (ref 28–44)
IVS/LV PW RATIO, ED: 0.98
LA diam end sys: 28 mm
LASIZE: 28 mm
LV E/e' medial: 7.35
LV e' LATERAL: 13.3 cm/s
LVEEAVG: 7.35
MV Dec: 136
MV VTI: 170 cm
MV pk A vel: 125 m/s
MVPG: 4 mmHg
MVPKEVEL: 97.7 m/s
PISA EROA: 0.03 cm2
PW: 7.64 mm — AB (ref 0.6–1.1)
RV LATERAL S' VELOCITY: 16.3 cm/s
TDI e' lateral: 13.3
TDI e' medial: 6.85

## 2015-10-02 LAB — CBC
HCT: 30.3 % — ABNORMAL LOW (ref 36.0–46.0)
Hemoglobin: 9.2 g/dL — ABNORMAL LOW (ref 12.0–15.0)
MCH: 26.4 pg (ref 26.0–34.0)
MCHC: 30.4 g/dL (ref 30.0–36.0)
MCV: 87.1 fL (ref 78.0–100.0)
PLATELETS: 398 10*3/uL (ref 150–400)
RBC: 3.48 MIL/uL — AB (ref 3.87–5.11)
RDW: 18 % — AB (ref 11.5–15.5)
WBC: 19.1 10*3/uL — AB (ref 4.0–10.5)

## 2015-10-02 LAB — URINALYSIS, ROUTINE W REFLEX MICROSCOPIC
BILIRUBIN URINE: NEGATIVE
Glucose, UA: NEGATIVE mg/dL
KETONES UR: 40 mg/dL — AB
NITRITE: NEGATIVE
Protein, ur: 30 mg/dL — AB
Specific Gravity, Urine: 1.026 (ref 1.005–1.030)
pH: 5.5 (ref 5.0–8.0)

## 2015-10-02 LAB — CBC AND DIFFERENTIAL
HEMATOCRIT: 30 % — AB (ref 36–46)
HEMOGLOBIN: 9.2 g/dL — AB (ref 12.0–16.0)
PLATELETS: 398 10*3/uL (ref 150–399)
WBC: 19.1 10^3/mL

## 2015-10-02 LAB — FOLATE: Folate: 37.9 ng/mL (ref >5.9)

## 2015-10-02 LAB — RETICULOCYTES
RBC.: 3.48 MIL/uL — ABNORMAL LOW (ref 3.87–5.11)
RETIC COUNT ABSOLUTE: 107.9 10*3/uL (ref 19.0–186.0)
RETIC CT PCT: 3.1 % (ref 0.4–3.1)

## 2015-10-02 LAB — VITAMIN D 25 HYDROXY (VIT D DEFICIENCY, FRACTURES): VIT D 25 HYDROXY: 75.4 ng/mL (ref 30.0–100.0)

## 2015-10-02 LAB — VITAMIN B12: VITAMIN B 12: 1316 pg/mL — AB (ref 180–914)

## 2015-10-02 LAB — FERRITIN: FERRITIN: 67 ng/mL (ref 11–307)

## 2015-10-02 SURGERY — INSERTION, INTRAMEDULLARY ROD, FEMUR
Anesthesia: General | Laterality: Left

## 2015-10-02 MED ORDER — FENTANYL CITRATE (PF) 100 MCG/2ML IJ SOLN
INTRAMUSCULAR | Status: DC | PRN
  Administered 2015-10-02: 100 ug via INTRAVENOUS
  Administered 2015-10-02: 50 ug via INTRAVENOUS
  Administered 2015-10-02 (×2): 25 ug via INTRAVENOUS
  Administered 2015-10-02: 50 ug via INTRAVENOUS

## 2015-10-02 MED ORDER — FENTANYL CITRATE (PF) 250 MCG/5ML IJ SOLN
INTRAMUSCULAR | Status: AC
  Filled 2015-10-02: qty 5

## 2015-10-02 MED ORDER — CEFAZOLIN SODIUM-DEXTROSE 2-4 GM/100ML-% IV SOLN
INTRAVENOUS | Status: AC
Start: 2015-10-02 — End: 2015-10-02
  Administered 2015-10-02: 1 g via INTRAVENOUS
  Filled 2015-10-02: qty 100

## 2015-10-02 MED ORDER — LIDOCAINE 2% (20 MG/ML) 5 ML SYRINGE
INTRAMUSCULAR | Status: AC
  Filled 2015-10-02: qty 5

## 2015-10-02 MED ORDER — PROPOFOL 10 MG/ML IV BOLUS
INTRAVENOUS | Status: DC | PRN
  Administered 2015-10-02: 70 mg via INTRAVENOUS

## 2015-10-02 MED ORDER — METOPROLOL TARTRATE 5 MG/5ML IV SOLN
INTRAVENOUS | Status: DC | PRN
  Administered 2015-10-02: 2.5 mg via INTRAVENOUS
  Administered 2015-10-02: .5 mg via INTRAVENOUS

## 2015-10-02 MED ORDER — NEOSTIGMINE METHYLSULFATE 10 MG/10ML IV SOLN
INTRAVENOUS | Status: DC | PRN
  Administered 2015-10-02: 2 mg via INTRAVENOUS

## 2015-10-02 MED ORDER — POTASSIUM CHLORIDE IN NACL 20-0.9 MEQ/L-% IV SOLN
INTRAVENOUS | Status: DC
  Administered 2015-10-02: 1 mL via INTRAVENOUS
  Administered 2015-10-03 – 2015-10-04 (×2): via INTRAVENOUS
  Filled 2015-10-02 (×3): qty 1000

## 2015-10-02 MED ORDER — PHENYLEPHRINE HCL 10 MG/ML IJ SOLN
INTRAMUSCULAR | Status: DC | PRN
  Administered 2015-10-02 (×2): 80 ug via INTRAVENOUS
  Administered 2015-10-02: 120 ug via INTRAVENOUS
  Administered 2015-10-02: 40 ug via INTRAVENOUS
  Administered 2015-10-02: 80 ug via INTRAVENOUS

## 2015-10-02 MED ORDER — LACTATED RINGERS IV SOLN
INTRAVENOUS | Status: DC
  Administered 2015-10-02 (×3): via INTRAVENOUS

## 2015-10-02 MED ORDER — PROPOFOL 10 MG/ML IV BOLUS
INTRAVENOUS | Status: AC
  Filled 2015-10-02: qty 20

## 2015-10-02 MED ORDER — FENTANYL CITRATE (PF) 100 MCG/2ML IJ SOLN: 25.0000 ug | INTRAMUSCULAR | Status: DC | PRN

## 2015-10-02 MED ORDER — ONDANSETRON HCL 4 MG/2ML IJ SOLN
INTRAMUSCULAR | Status: DC | PRN
  Administered 2015-10-02: 4 mg via INTRAVENOUS

## 2015-10-02 MED ORDER — DEXTROSE 5 % IV SOLN
10.0000 mg | INTRAVENOUS | Status: DC | PRN
  Administered 2015-10-02: 30 ug/min via INTRAVENOUS

## 2015-10-02 MED ORDER — ROCURONIUM BROMIDE 100 MG/10ML IV SOLN
INTRAVENOUS | Status: DC | PRN
  Administered 2015-10-02: 50 mg via INTRAVENOUS

## 2015-10-02 MED ORDER — PHENYLEPHRINE 40 MCG/ML (10ML) SYRINGE FOR IV PUSH (FOR BLOOD PRESSURE SUPPORT)
PREFILLED_SYRINGE | INTRAVENOUS | Status: AC
  Filled 2015-10-02: qty 10

## 2015-10-02 MED ORDER — GLYCOPYRROLATE 0.2 MG/ML IJ SOLN
INTRAMUSCULAR | Status: DC | PRN
  Administered 2015-10-02: 0.2 mg via INTRAVENOUS

## 2015-10-02 MED ORDER — LIDOCAINE HCL (CARDIAC) 20 MG/ML IV SOLN
INTRAVENOUS | Status: DC | PRN
  Administered 2015-10-02: 60 mg via INTRAVENOUS

## 2015-10-02 SURGICAL SUPPLY — 96 items
BANDAGE ACE 3X5.8 VEL STRL LF (GAUZE/BANDAGES/DRESSINGS) ×3 IMPLANT
BANDAGE ELASTIC 4 VELCRO ST LF (GAUZE/BANDAGES/DRESSINGS) ×3 IMPLANT
BIT DRILL 2.2 SS TIBIAL (BIT) ×3 IMPLANT
BLADE SURG 10 STRL SS (BLADE) ×3 IMPLANT
BLADE SURG 15 STRL LF DISP TIS (BLADE) ×1 IMPLANT
BLADE SURG 15 STRL SS (BLADE) ×2
BLADE SURG ROTATE 9660 (MISCELLANEOUS) IMPLANT
BNDG ESMARK 4X9 LF (GAUZE/BANDAGES/DRESSINGS) ×3 IMPLANT
BNDG GAUZE ELAST 4 BULKY (GAUZE/BANDAGES/DRESSINGS) ×3 IMPLANT
CLOSURE WOUND 1/2 X4 (GAUZE/BANDAGES/DRESSINGS) ×1
CORDS BIPOLAR (ELECTRODE) ×3 IMPLANT
COVER PERINEAL POST (MISCELLANEOUS) ×3 IMPLANT
COVER SURGICAL LIGHT HANDLE (MISCELLANEOUS) ×3 IMPLANT
CUFF TOURN SGL LL 12 NO SLV (MISCELLANEOUS) ×3 IMPLANT
CUFF TOURNIQUET SINGLE 18IN (TOURNIQUET CUFF) IMPLANT
CUFF TOURNIQUET SINGLE 24IN (TOURNIQUET CUFF) IMPLANT
DRAIN TLS ROUND 10FR (DRAIN) IMPLANT
DRAPE C-ARM 42X72 X-RAY (DRAPES) ×3 IMPLANT
DRAPE INCISE IOBAN 66X45 STRL (DRAPES) IMPLANT
DRAPE OEC MINIVIEW 54X84 (DRAPES) IMPLANT
DRAPE ORTHO SPLIT 77X108 STRL (DRAPES)
DRAPE PROXIMA HALF (DRAPES) IMPLANT
DRAPE STERI IOBAN 125X83 (DRAPES) ×3 IMPLANT
DRAPE SURG ORHT 6 SPLT 77X108 (DRAPES) IMPLANT
DRAPE U-SHAPE 47X51 STRL (DRAPES) ×3 IMPLANT
DRSG AQUACEL AG ADV 3.5X 4 (GAUZE/BANDAGES/DRESSINGS) ×3 IMPLANT
DRSG MEPILEX BORDER 4X4 (GAUZE/BANDAGES/DRESSINGS) IMPLANT
DRSG MEPILEX BORDER 4X8 (GAUZE/BANDAGES/DRESSINGS) ×3 IMPLANT
DRSG PAD ABDOMINAL 8X10 ST (GAUZE/BANDAGES/DRESSINGS) IMPLANT
DURAPREP 26ML APPLICATOR (WOUND CARE) ×6 IMPLANT
ELECT REM PT RETURN 9FT ADLT (ELECTROSURGICAL) ×3
ELECTRODE REM PT RTRN 9FT ADLT (ELECTROSURGICAL) ×1 IMPLANT
FACESHIELD WRAPAROUND (MASK) ×3 IMPLANT
GAUZE SPONGE 4X4 12PLY STRL (GAUZE/BANDAGES/DRESSINGS) IMPLANT
GAUZE XEROFORM 1X8 LF (GAUZE/BANDAGES/DRESSINGS) ×3 IMPLANT
GAUZE XEROFORM 5X9 LF (GAUZE/BANDAGES/DRESSINGS) ×3 IMPLANT
GLOVE BIO SURGEON ST LM GN SZ9 (GLOVE) ×3 IMPLANT
GLOVE BIOGEL PI IND STRL 8 (GLOVE) ×2 IMPLANT
GLOVE BIOGEL PI INDICATOR 8 (GLOVE) ×4
GLOVE SURG ORTHO 8.0 STRL STRW (GLOVE) ×6 IMPLANT
GOWN STRL REUS W/ TWL LRG LVL3 (GOWN DISPOSABLE) ×2 IMPLANT
GOWN STRL REUS W/ TWL XL LVL3 (GOWN DISPOSABLE) ×1 IMPLANT
GOWN STRL REUS W/TWL LRG LVL3 (GOWN DISPOSABLE) ×4
GOWN STRL REUS W/TWL XL LVL3 (GOWN DISPOSABLE) ×2
GUIDE PIN 3.2X343 (PIN) ×2
GUIDE PIN 3.2X343MM (PIN) ×4
K-WIRE 1.6 (WIRE) ×4
K-WIRE FX5X1.6XNS BN SS (WIRE) ×2
KIT BASIN OR (CUSTOM PROCEDURE TRAY) ×3 IMPLANT
KIT ROOM TURNOVER OR (KITS) ×3 IMPLANT
KWIRE FX5X1.6XNS BN SS (WIRE) ×2 IMPLANT
LINER BOOT UNIVERSAL DISP (MISCELLANEOUS) ×3 IMPLANT
MANIFOLD NEPTUNE II (INSTRUMENTS) IMPLANT
NAIL TRIGEN 10X320MM 125D LEFT (Nail) ×2 IMPLANT
NEEDLE 22X1 1/2 (OR ONLY) (NEEDLE) IMPLANT
NS IRRIG 1000ML POUR BTL (IV SOLUTION) ×3 IMPLANT
PACK GENERAL/GYN (CUSTOM PROCEDURE TRAY) ×3 IMPLANT
PACK ORTHO EXTREMITY (CUSTOM PROCEDURE TRAY) ×3 IMPLANT
PAD ARMBOARD 7.5X6 YLW CONV (MISCELLANEOUS) ×6 IMPLANT
PAD CAST 3X4 CTTN HI CHSV (CAST SUPPLIES) ×1 IMPLANT
PAD CAST 4YDX4 CTTN HI CHSV (CAST SUPPLIES) ×1 IMPLANT
PADDING CAST COTTON 3X4 STRL (CAST SUPPLIES) ×2
PADDING CAST COTTON 4X4 STRL (CAST SUPPLIES) ×2
PEG LOCKING SMOOTH 2.2X16 (Screw) ×3 IMPLANT
PEG LOCKING SMOOTH 2.2X18 (Peg) ×9 IMPLANT
PEG LOCKING SMOOTH 2.2X20 (Screw) ×3 IMPLANT
PEG LOCKING SMOOTH 2.2X22 (Screw) ×3 IMPLANT
PIN GUIDE 3.2X343MM (PIN) ×2 IMPLANT
PLATE NARROW DVR LEFT (Plate) ×3 IMPLANT
SCREW LAG COMPR KIT 75/70 (Screw) ×3 IMPLANT
SCREW LOCK 12X2.7X 3 LD (Screw) ×1 IMPLANT
SCREW LOCKING 2.7X12MM (Screw) ×2 IMPLANT
SCREW LOCKING 2.7X13MM (Screw) ×12 IMPLANT
SPLINT FIBERGLASS 3X35 (CAST SUPPLIES) ×3 IMPLANT
SPONGE GAUZE 4X4 12PLY STER LF (GAUZE/BANDAGES/DRESSINGS) ×3 IMPLANT
SPONGE LAP 4X18 X RAY DECT (DISPOSABLE) IMPLANT
STAPLER VISISTAT 35W (STAPLE) ×3 IMPLANT
STRIP CLOSURE SKIN 1/2X4 (GAUZE/BANDAGES/DRESSINGS) ×2 IMPLANT
SUCTION FRAZIER HANDLE 10FR (MISCELLANEOUS) ×2
SUCTION TUBE FRAZIER 10FR DISP (MISCELLANEOUS) ×1 IMPLANT
SUT ETHILON 2 0 FS 18 (SUTURE) IMPLANT
SUT ETHILON 3 0 PS 1 (SUTURE) ×6 IMPLANT
SUT PROLENE 3 0 PS 1 (SUTURE) IMPLANT
SUT VIC AB 2-0 CT1 36 (SUTURE) ×3 IMPLANT
SUT VIC AB 2-0 CTB1 (SUTURE) IMPLANT
SUT VIC AB 3-0 X1 27 (SUTURE) IMPLANT
SUT VICRYL 0 UR6 27IN ABS (SUTURE) ×3 IMPLANT
SYR CONTROL 10ML LL (SYRINGE) IMPLANT
SYSTEM CHEST DRAIN TLS 7FR (DRAIN) IMPLANT
TAPE STRIPS DRAPE STRL (GAUZE/BANDAGES/DRESSINGS) IMPLANT
TOWEL OR 17X24 6PK STRL BLUE (TOWEL DISPOSABLE) ×3 IMPLANT
TOWEL OR 17X26 10 PK STRL BLUE (TOWEL DISPOSABLE) ×3 IMPLANT
TUBE CONNECTING 12'X1/4 (SUCTIONS) ×1
TUBE CONNECTING 12X1/4 (SUCTIONS) ×2 IMPLANT
WATER STERILE IRR 1000ML POUR (IV SOLUTION) IMPLANT
YANKAUER SUCT BULB TIP NO VENT (SUCTIONS) IMPLANT

## 2015-10-02 NOTE — Care Management Important Message (Signed)
Important Message  Patient Details  Name: Martyn Malaylma Stilley MRN: [id number] Date of Birth: 03/25/1929   Medicare Important Message Given:  Yes    Bernadette HoitShoffner, Jazira Maloney Coleman 10/02/2015, 11:13 AM

## 2015-10-02 NOTE — Consult Note (Signed)
Consultation Note Date: 10/02/2015   Patient Name: Kristen Clark  DOB: 1930/01/01  MRN: [id number]  Age / Sex: 82 y.o., female  PCP: Velna Hatchet, MD Referring Physician: Oswald Hillock, MD  Reason for Consultation: Establishing goals of care  HPI/Patient Profile: 82 y.o. female  with past medical history of dementia, insomnia, anxiety admitted on 10/01/2015 with left femoral neck fracture.  Clinical Assessment and Goals of Care: I met with the patient and dtr at bedside and then met with the dtr, Kristen Clark, separately.  The patient moved from [place] to [place] in the last couple of years.  Kristen Clark insisted that her mother move closer to her so that she could take care of her.  Initially she lived in [place] and enjoyed it there.  However Kristen Clark noticed that her mother was starting to lose significant weight - so she moved her into her home.  She is her sole care taker.  Kristen Clark's brother does not participate in their mother's care.    Kristen Clark is ambulatory and was independent with her ADLs.  Kristen Clark made certain she ate, drank protein drinks and went to her doctor's appts.  Kristen Clark is both the HCPOA and the durable POA.  The patient's  Goals are to have the surgical repair for her hip, rehab and return to [place] home.   She is a DNR / DNI, but does want full scope treatment including a feeding tube for a limited trial if necessary.  We completed a MOST form.  Kristen Clark and I discussed her mother's mitral prolapse, osteo porosis, and general decline.  This consult is for review of medical treatment options, clarification of goals of care and end of life issues, and symptom management recommendations.  The difference between aggressive medical intervention and comfort care was considered in light of the patient's goals of care.  Concepts of Hospice and Palliative Care as well as natural disease trajectory and expectations at EOL  were discussed. Questions and concerns were addressed.  Hard Choices booklet left for review. The family was encouraged to call with questions or concerns.  PMT will continue to support holistically.   NEXT OF KIN, HCPOA:  Kristen Clark, dtr.    SUMMARY OF RECOMMENDATIONS   DNR / DNI.  Full scope treatment MOST form was completed, signed, and left on the paper chart.  Psycho-social/Spiritual:   Desire for further Chaplaincy support:yes.  Kristen Clark is [place].  A request to Chaplain services was placed.  Additional Recommendations: Caregiving  Support/Resources  Prognosis:   Unable to determine  Discharge Planning: [place] for rehab with Palliative care service follow-up      Primary Diagnoses: Present on Admission:  . (Resolved) Left displaced femoral neck fracture ([place]) . Left radial fracture . Acute hyperglycemia . Osteopenia . Fracture of femoral neck, left ([place]) . Dementia . Severe protein-calorie malnutrition Kristen Clark: less than 60% of standard weight) ([place]) . Abnormal ECG . Vitamin D deficiency . Normocytic anemia  I have reviewed the medical record, interviewed the patient and family, and  examined the patient. The following aspects are pertinent.  Past Medical History  Diagnosis Date  . Femoral fracture ([place]) 09/30/2015    Fracture of femoral neck, left after mechanical fall  . Left radial fracture 09/30/2015    after mechanical fall  . Insomnia   . Arthritis     "generalized" (10/01/2015)  . Anxiety   . Dementia     "aggressive" (10/01/2015)   Social History   Social History  . Marital Status: Widowed    Spouse Name: N/A  . Number of Children: N/A  . Years of Education: N/A   Social History Main Topics  . Smoking status: Never Smoker   . Smokeless tobacco: Never Used  . Alcohol Use: No  . Drug Use: No  . Sexual Activity: No   Other Topics Concern  . None   Social History Narrative  . None   History reviewed. No pertinent family  history. Scheduled Meds: . cholecalciferol  1,000 Units Oral Daily  . docusate sodium  100 mg Oral BID  . donepezil  10 mg Oral QHS  . feeding supplement (ENSURE ENLIVE)  237 mL Oral BID BM  . ferrous sulfate  325 mg Oral TID PC  . heparin  5,000 Units Subcutaneous Q8H  . multivitamin with minerals  1 tablet Oral Daily   Continuous Infusions:  PRN Meds:.haloperidol lactate, HYDROmorphone (DILAUDID) injection, LORazepam, morphine injection Medications Prior to Admission:  Prior to Admission medications   Medication Sig Start Date End Date Taking? Authorizing Provider  cholecalciferol (VITAMIN D) 1000 units tablet Take 1,000 Units by mouth daily.   Yes Historical Provider, MD  Cyanocobalamin (VITAMIN B 12 PO) Take 1 tablet by mouth daily.   Yes Historical Provider, MD  donepezil (ARICEPT) 10 MG tablet Take 10 mg by mouth at bedtime.    Yes Historical Provider, MD  ferrous sulfate 325 (65 FE) MG tablet Take 325 mg by mouth daily with breakfast.   Yes Historical Provider, MD  Multiple Vitamins-Minerals (MULTIVITAMIN WITH MINERALS) tablet Take 1 tablet by mouth daily.   Yes Historical Provider, MD  vitamin A 10000 UNIT capsule Take 10,000 Units by mouth daily.   Yes Historical Provider, MD   No Known Allergies Review of Systems:  Patient with left wrist and left leg pain.  Loose stools.  No complaints of CP, SOB, dysphagia, AP, dysuria.  Physical Exam  Very frail, cheerful elderly lady.  Making jokes.  Mildly confused but very pleasant CV:  Tachycardic and irregular with sys murmur. Resp:  No distress Abdomen:  Soft, nt Extremities:  No edema.  Vital Signs: BP 111/60 mmHg  Pulse 117  Temp(Src) 98.3 F (36.8 C) (Oral)  Resp 17  Ht 4' 7"  (1.397 m)  Wt 28.214 kg (62 lb 3.2 oz)  BMI 14.46 kg/m2  SpO2 93% Pain Assessment: No/denies pain   Pain Score: 0-No pain   SpO2: SpO2: 93 % O2 Device:SpO2: 93 % O2 Flow Rate: .   IO: Intake/output summary:  Intake/Output Summary (Last 24  hours) at 10/02/15 1446 Last data filed at 10/02/15 6948  Gross per 24 hour  Intake    120 ml  Output     50 ml  Net     70 ml    LBM:   Baseline Weight: Weight: 28.214 kg (62 lb 3.2 oz) Most recent weight: Weight: 28.214 kg (62 lb 3.2 oz)     Palliative Assessment/Data:   Flowsheet Rows        Most Recent  Value   Intake Tab    Referral Department  Hospitalist   Unit at Time of Referral  Med/Surg Unit   Palliative Care Primary Diagnosis  Sepsis/Infectious Disease   Date Notified  10/01/15   Palliative Care Type  New Palliative care   Reason for referral  Clarify Goals of Care   Date of Admission  10/01/15   Date first seen by Palliative Care  10/02/15   # of days Palliative referral response time  1 Day(s)   # of days IP prior to Palliative referral  0   Clinical Assessment    Palliative Performance Scale Score  30%   Psychosocial & Spiritual Assessment    Palliative Care Outcomes    Patient/Family meeting held?  Yes   Who was at the meeting?  dtr Kristen Clark and patient   Palliative Care Outcomes  Clarified goals of care   Other Treatment Preference Instructions  completed MOST form.      Time In: 1:40 Time Out: 2:45 Time Total: 65 min Greater than 50%  of this time was spent counseling and coordinating care related to the above assessment and plan.  Signed by: Imogene Burn, PA-C Palliative Medicine Pager: [phone]   Please contact Palliative Medicine Team phone at [phone] for questions and concerns.  For individual provider: See Shea Evans

## 2015-10-02 NOTE — Progress Notes (Signed)
Sacral foam prophylaxis remains in place.

## 2015-10-02 NOTE — Brief Op Note (Signed)
10/01/2015 - 10/02/2015  8:31 PM  PATIENT:  Kristen Clark  82 y.o. female  PRE-OPERATIVE DIAGNOSIS:  fractred left hip and left wrist  POST-OPERATIVE DIAGNOSIS:  fractred left hip and left wrist  PROCEDURE:  Procedure(s): INTRAMEDULLARY (IM) NAIL FEMORAL OPEN REDUCTION INTERNAL FIXATION (ORIF) WRIST FRACTURE  SURGEON:  Surgeon(s): Cammy CopaScott Teagen Bucio, MD  ASSISTANT: April Green RNFA  ANESTHESIA:   general  EBL: 50 ml    Total I/O In: 1000 [I.V.:1000] Out: 300 [Urine:300]  BLOOD ADMINISTERED: none  DRAINS: none   LOCAL MEDICATIONS USED:  none  SPECIMEN:  No Specimen  COUNTS:  YES  TOURNIQUET:   Total Tourniquet Time Documented: Upper Arm (Left) - 37 minutes Total: Upper Arm (Left) - 37 minutes   DICTATION: .Other Dictation: Dictation Number dictated  PLAN OF CARE: Admit to inpatient   PATIENT DISPOSITION:  PACU - hemodynamically stable

## 2015-10-02 NOTE — Anesthesia Postprocedure Evaluation (Signed)
Anesthesia Post Note  Patient: Kristen Clark  Procedure(s) Performed: Procedure(s) (LRB): INTRAMEDULLARY (IM) NAIL FEMORAL (Left) OPEN REDUCTION INTERNAL FIXATION (ORIF) WRIST FRACTURE (Left)  Patient location during evaluation: PACU Anesthesia Type: General Level of consciousness: awake, awake and alert and oriented Pain management: pain level controlled Vital Signs Assessment: post-procedure vital signs reviewed and stable Respiratory status: spontaneous breathing, nonlabored ventilation and respiratory function stable Cardiovascular status: blood pressure returned to baseline Anesthetic complications: no    Last Vitals:  Filed Vitals:   10/02/15 2050 10/02/15 2100  BP: 113/56 101/55  Pulse: 103   Temp:    Resp: 15 14    Last Pain:  Filed Vitals:   10/02/15 2102  PainSc: 0-No pain                 Crissie Aloi COKER

## 2015-10-02 NOTE — Progress Notes (Signed)
   10/02/15 1444  Clinical Encounter Type  Visited With Patient;Health care provider  Visit Type Initial;Pre-op  Referral From Palliative care team   Chaplain responded to a consult from [place]. Chaplain met with patient, facilitated a brief life review, and offered support. Chaplain introduced spiritual care services. Patient expressed she enjoyed having someone to talk to, so chaplain will seek to follow-up. Spiritual care services available as needed.    Jeri Lager, Chaplain 10/02/2015 2:46 PM

## 2015-10-02 NOTE — Progress Notes (Signed)
Initial Nutrition Assessment  DOCUMENTATION CODES:   Severe malnutrition in context of chronic illness  INTERVENTION:   Continue Ensure Enlive po BID, once diet resumes.  Each supplement provides 350 kcal and 20 grams of protein  NUTRITION DIAGNOSIS:   Malnutrition related to chronic illness as evidenced by severe depletion of body fat, severe depletion of muscle mass.  GOAL:   Patient will meet greater than or equal to 90% of their needs  MONITOR:   PO intake, I & O's, Diet advancement, Weight trends, Labs  REASON FOR ASSESSMENT:   Consult Assessment of nutrition requirement/status  ASSESSMENT:   Kristen Clark is a 82 y.o. female with a Past Medical History of dementia, anemia, protein calorie malnutrition who presents with hip and radial fracture. Pt very deconditioned in general and w/ apparent poor overall quality of life.  Medications reviewed and include: colace, vitamin D, ferrous sulfate, MVI Labs reviewed: Fe 8 Nutrition-Focused physical exam completed. Findings are severe fat depletion, severe muscle depletion, and no edema.  Unable to answer questions. No family present.    Diet Order:  Diet NPO time specified  Skin:  Wound (see comment) (stage I sacrum)  Last BM:  unknown  Height:   Ht Readings from Last 1 Encounters:  10/01/15 4\' 7"  (1.397 m)    Weight:   Wt Readings from Last 1 Encounters:  10/01/15 62 lb 3.2 oz (28.214 kg)    Ideal Body Weight:  41.65 kg  BMI:  Body mass index is 14.46 kg/(m^2).  Estimated Nutritional Needs:   Kcal:  1100-1300  Protein:  55-65 grams  Fluid:  >1.5 L/day  EDUCATION NEEDS:   No education needs identified at this time  Kendell BaneHeather Ephrem Carrick RD, LDN, CNSC [phone] Pager [phone] After Hours Pager

## 2015-10-02 NOTE — Progress Notes (Signed)
[facility]  PROGRESS NOTE  Kristen Clark ZOX:[id number] DOB: 12/28/1929 DOA: 10/01/2015 PCP: Velna Hatchet, MD    Brief HPI:  82 y.o. female with medical history significant for dementia on Aricept, significant protein calorie malnutrition with continued weight loss, vitamin D deficiency and osteopenia who presents to the ER after experiencing mechanical fall yesterday. Patient had been visiting a family friend along with her daughter when the family friend's dog rushed the patient knocking her legs out from under her and she fell on her left side. She was having some pain but not significant enough to warrant the [facility] medical attention. Unfortunately after waking up sweating the pain was very severe and patient decided to seek medical treatment.  Principal Problem:   Fracture of femoral neck, left (HCC) Active Problems:   Left radial fracture   Acute hyperglycemia   Osteopenia   Dementia   Severe protein-calorie malnutrition [name]: less than 60% of standard weight) (HCC)   Abnormal ECG   Vitamin D deficiency   Normocytic anemia   Pressure ulcer   Palliative care encounter   Goals of care, counseling/discussion   Assessment/Plan:  Fracture of femoral neck, left after mechanical fall -Comminuted hip fracture and patient with witnessed mechanical fall and known osteopenia and vitamin D deficiency -Orthopedic surgery plans operative intervention on 7/12   Left radial fracture -Splint applied in the ER at direction of orthopedic team   Abnormal ECG -Voltage criteria met for LVH and there was a question of cardiomegaly on the chest x-ray - Echocardiogram showed EF 60- 65%.   Acute hyperglycemia/leukocytosis -Likely related to acute stressors of pain and injury -Repeat lab in a.m. UA is negative  Osteopenia/Vitamin D deficiency -She was on vitamin D as well as calcium supplementation prior to the patient -Follow up on calcium, albumin and vitamin D labs obtained at  admission   Dementia -Continue preadmission Aricept   Severe protein-calorie malnutrition [name]: less than 60% of standard weight)  -Chronic ongoing problem and likely related to patient's advancing dementia -Was taking Ensure Plus one time daily at home -Continue nutritional supplements but increase to twice a day while acutely stressed for surgery and hospitalization -Attrition consultation   Normocytic anemia -Baseline hemoglobin unknown -On iron supplementation prior to admission -Anemia panel : B12 1316.   DVT prophylaxis: Heparin Code Status: DNR Family Communication: No family at bedside Disposition Plan:    Consultants:  Orthopedics  Antibiotics:  None   Subjective: Patient seen and examined, lethargic.  Objective: Filed Vitals:   10/02/15 0226 10/02/15 0603 10/02/15 1004 10/02/15 1346  BP: 120/72 118/60 95/56 111/60  Pulse: 118 124 123 117  Temp: 98.8 F (37.1 C) 98.4 F (36.9 C) 98.9 F (37.2 C) 98.3 F (36.8 C)  TempSrc: Oral Oral Oral Oral  Resp: _0 Height:      Weight:      SpO2: 98% 93% 94% 93%    Intake/Output Summary (Last 24 hours) at 10/02/15 1501 Last data filed at 10/02/15 8119  Gross per 24 hour  Intake    120 ml  Output     50 ml  Net     70 ml   Filed Weights   10/01/15 1835  Weight: 28.214 kg (62 lb 3.2 oz)    Examination:  General exam: Appears calm and comfortable  Respiratory system: Clear to auscultation. Respiratory effort normal. Cardiovascular system: S1 & S2 heard, RRR. No JVD, murmurs, rubs, gallops or clicks. No pedal edema. Gastrointestinal system:  Abdomen is nondistended, soft and nontender. No organomegaly or masses felt. Normal bowel sounds heard. Central nervous system: Somnolent, opens eyes to verbal stimuli. Extremities: Symmetric 5 x 5 power. Skin: No rashes, lesions or ulcers    Data Reviewed: I have personally reviewed following labs and imaging studies Basic Metabolic  Panel:  Recent Labs Lab 10/01/15 1244 10/01/15 1258 10/01/15 1515 10/02/15 0711  NA 138 138  --  139  K 4.2 4.2  --  4.1  CL 102 101  --  103  CO2 25  --   --  27  GLUCOSE 129* 126*  --  107*  BUN 19 20  --  24*  CREATININE 0.52 0.50  --  0.60  CALCIUM 8.4*  --  8.2* 8.3*   Liver Function Tests:  Recent Labs Lab 10/01/15 1515  ALBUMIN 3.5   CBC:  Recent Labs Lab 10/01/15 1244 10/01/15 1258 10/02/15 0711  WBC 18.9*  --  19.1*  HGB 10.1* 11.9* 9.2*  HCT 33.7* 35.0* 30.3*  MCV 88.5  --  87.1  PLT 368  --  398     Recent Results (from the past 240 hour(s))  MRSA PCR Screening     Status: None   Collection Time: 10/01/15  4:47 PM  Result Value Ref Range Status   MRSA by PCR NEGATIVE NEGATIVE Final    Comment:        The GeneXpert MRSA Assay (FDA approved for NASAL specimens only), is one component of a comprehensive MRSA colonization surveillance program. It is not intended to diagnose MRSA infection nor to guide or monitor treatment for MRSA infections.      Studies: Dg Chest 2 View  10/01/2015  CLINICAL DATA:  Fall yesterday walking the dog EXAM: CHEST  2 VIEW COMPARISON:  None. FINDINGS: Borderline cardiomegaly. Hyperinflation is noted. No infiltrate or pulmonary edema. Thoracic spine osteopenia. No gross fractures are noted. No pneumothorax. IMPRESSION: No active disease. Hyperinflation. Cardiomegaly. Thoracic spine osteopenia. No gross fractures. No pneumothorax. Electronically Signed   By: Lahoma Crocker M.D.   On: 10/01/2015 14:00   Dg Wrist Complete Left  10/01/2015  CLINICAL DATA:  Status post fall a few days ago with a left wrist injury. Pain and swelling. Initial encounter. EXAM: LEFT WRIST - COMPLETE 3+ VIEW COMPARISON:  None. FINDINGS: The patient has a fracture of the distal radius with intra-articular involvement. There is impaction of the volar aspect of the radius above 0.6 cm. The fracture appears mildly comminuted. No other acute bony or joint  abnormality is seen. Bones are osteopenic. Atherosclerosis is noted. Soft tissue swelling is present about the wrist. First CMC and scaphoid trapezium trapezoid joint osteoarthritis is noted. IMPRESSION: Impacted distal radius fracture involves articular surface. Osteopenia. Atherosclerosis. Electronically Signed   By: Inge Rise M.D.   On: 10/01/2015 12:20   Ct Head Wo Contrast  10/01/2015  CLINICAL DATA:  82 year old female with a history of fall EXAM: CT HEAD WITHOUT CONTRAST TECHNIQUE: Contiguous axial images were obtained from the base of the skull through the vertex without intravenous contrast. COMPARISON:  None. FINDINGS: Unremarkable appearance of the calvarium without acute fracture or aggressive lesion. Unremarkable appearance of the scalp soft tissues. Unremarkable appearance of the bilateral orbits. Mastoid air cells are clear. No significant paranasal sinus disease No acute intracranial hemorrhage. No midline shift or mass effect. Gray-white differentiation relatively maintained. Confluent hypodensity in the periventricular white matter. Mild brain volume loss. Suggesting calcifications. IMPRESSION: No CT evidence of acute  intracranial abnormality. Senescent brain volume loss and chronic white matter disease. Signed, Dulcy Fanny. Earleen Newport, DO [facility] Electronically Signed   By: Corrie Mckusick D.O.   On: 10/01/2015 13:44   Dg Hip Unilat With Pelvis 2-3 Views Left  10/01/2015  CLINICAL DATA:  82 year old female with a history of fall a few days prior. Ongoing hip pain EXAM: DG HIP (WITH OR WITHOUT PELVIS) 2-3V LEFT COMPARISON:  None. FINDINGS: Diffuse osteopenia. Acute fracture of the left femoral neck, with proximal migration of the femur fracture fragment. Bony pelvic ring appears intact with no pelvic fracture identified. Degenerative changes of the lower lumbar spine. Degenerative changes the bilateral hips. Extensive vascular  calcifications. IMPRESSION: Acute left femoral neck fracture with angulation at the fracture site and proximal migration of the distal fracture fragment. Atherosclerosis Signed, Dulcy Fanny. Earleen Newport, DO [facility] Electronically Signed   By: Corrie Mckusick D.O.   On: 10/01/2015 12:25    Scheduled Meds: . cholecalciferol  1,000 Units Oral Daily  . docusate sodium  100 mg Oral BID  . donepezil  10 mg Oral QHS  . feeding supplement (ENSURE ENLIVE)  237 mL Oral BID BM  . ferrous sulfate  325 mg Oral TID PC  . heparin  5,000 Units Subcutaneous Q8H  . multivitamin with minerals  1 tablet Oral Daily   Continuous Infusions:      Time spent: 25 min    [facility] Pager [phone]. If 7PM-7AM, please contact night-coverage at [email or web address], Office  [phone]  password [email or web address] 10/02/2015, 3:01 PM  LOS: 1 day

## 2015-10-02 NOTE — Progress Notes (Signed)
Pt came back from OR s/p IM nailing left femoral and ORIF wrist fracture left, alert and responsive no bleeding noted, dressing dry and intact, will continue to monitor.

## 2015-10-02 NOTE — Clinical Social Work Note (Signed)
CSW acknowledging social work consult for possible SNF placement.  Patient in surgery, CSW to assess at a later time pending PT and OT recommendations.  Ervin KnackEric R. Santiel Topper, MSW, Theresia MajorsLCSWA [phone] 10/02/2015 5:53 PM

## 2015-10-02 NOTE — Anesthesia Preprocedure Evaluation (Addendum)
Anesthesia Evaluation  Patient identified by MRN, date of birth, ID band Patient confused    Reviewed: Allergy & Precautions, NPO status , Patient's Chart, lab work & pertinent test results  Airway Mallampati: II  TM Distance: <3 FB Neck ROM: Limited    Dental  (+) Dental Advisory Given   Pulmonary neg pulmonary ROS,    breath sounds clear to auscultation       Cardiovascular negative cardio ROS   Rhythm:Regular Rate:Normal  10/01/15 Echo: Anterior leaflet of the mitral valve is thickened, myxomatous and prolapsing with posteriorly directed jet of the mitral regurgitation. Mitral regurgitation appears moderate.     Neuro/Psych PSYCHIATRIC DISORDERS Dementia    GI/Hepatic negative GI ROS, Neg liver ROS,   Endo/Other  negative endocrine ROS  Renal/GU negative Renal ROS     Musculoskeletal  (+) Arthritis ,   Abdominal   Peds  Hematology  (+) anemia ,   Anesthesia Other Findings   Reproductive/Obstetrics                            Lab Results  Component Value Date   WBC 19.1* 10/02/2015   HGB 9.2* 10/02/2015   HCT 30.3* 10/02/2015   MCV 87.1 10/02/2015   PLT 398 10/02/2015   Lab Results  Component Value Date   CREATININE 0.60 10/02/2015   BUN 24* 10/02/2015   NA 139 10/02/2015   K 4.1 10/02/2015   CL 103 10/02/2015   CO2 27 10/02/2015   Lab Results  Component Value Date   INR 1.15 10/01/2015    Anesthesia Physical Anesthesia Plan  ASA: III  Anesthesia Plan: General   Post-op Pain Management:    Induction: Intravenous  Airway Management Planned: Oral ETT  Additional Equipment:   Intra-op Plan:   Post-operative Plan: Extubation in OR  Informed Consent: I have reviewed the patients History and Physical, chart, labs and discussed the procedure including the risks, benefits and alternatives for the proposed anesthesia with the patient or authorized representative  who has indicated his/her understanding and acceptance.   Dental advisory given  Plan Discussed with: CRNA  Anesthesia Plan Comments:         Anesthesia Quick Evaluation

## 2015-10-02 NOTE — Transfer of Care (Signed)
Immediate Anesthesia Transfer of Care Note  Patient: Kristen Clark  Procedure(s) Performed: Procedure(s): INTRAMEDULLARY (IM) NAIL FEMORAL (Left) OPEN REDUCTION INTERNAL FIXATION (ORIF) WRIST FRACTURE (Left)  Patient Location: PACU  Anesthesia Type:General  Level of Consciousness: awake, alert  and patient cooperative  Airway & Oxygen Therapy: Patient Spontanous Breathing  Post-op Assessment: Report given to RN and Post -op Vital signs reviewed and stable  Post vital signs: Reviewed and stable  Last Vitals:  Filed Vitals:   10/02/15 1346 10/02/15 2036  BP: 111/60 121/66  Pulse: 117 88  Temp: 36.8 C 36.1 C  Resp: 17 14    Last Pain:  Filed Vitals:   10/02/15 2038  PainSc: 0-No pain         Complications: No apparent anesthesia complications

## 2015-10-02 NOTE — Anesthesia Procedure Notes (Signed)
Procedure Name: Intubation Date/Time: 10/02/2015 5:54 PM Performed by: Rosiland OzMEYERS, Kennis Wissmann Pre-anesthesia Checklist: Emergency Drugs available, Patient identified, Suction available, Patient being monitored and Timeout performed Patient Re-evaluated:Patient Re-evaluated prior to inductionOxygen Delivery Method: Circle system utilized Preoxygenation: Pre-oxygenation with 100% oxygen Intubation Type: IV induction Ventilation: Mask ventilation without difficulty Laryngoscope Size: Miller and 2 Grade View: Grade I Tube type: Oral Tube size: 7.0 mm Number of attempts: 1 Airway Equipment and Method: Stylet Placement Confirmation: ETT inserted through vocal cords under direct vision,  positive ETCO2 and breath sounds checked- equal and bilateral Secured at: 20 cm Tube secured with: Tape Dental Injury: Teeth and Oropharynx as per pre-operative assessment

## 2015-10-02 NOTE — Progress Notes (Signed)
Deanna ArtisKeisha, NT from 6N in to get pt's upper denture and 2 silver-colored rings which were removed from left ring finger. All placed in labelled cups.

## 2015-10-03 ENCOUNTER — Encounter (HOSPITAL_COMMUNITY): Payer: Self-pay | Admitting: Orthopedic Surgery

## 2015-10-03 DIAGNOSIS — D649 Anemia, unspecified: Secondary | ICD-10-CM

## 2015-10-03 LAB — URINE CULTURE

## 2015-10-03 MED ORDER — ONDANSETRON HCL 4 MG PO TABS: 4.0000 mg | ORAL_TABLET | Freq: Four times a day (QID) | ORAL | Status: DC | PRN

## 2015-10-03 MED ORDER — CEFAZOLIN IN D5W 1 GM/50ML IV SOLN
1.0000 g | Freq: Four times a day (QID) | INTRAVENOUS | Status: AC
  Administered 2015-10-03 (×2): 1 g via INTRAVENOUS
  Filled 2015-10-03 (×3): qty 50

## 2015-10-03 MED ORDER — HYDROCODONE-ACETAMINOPHEN 5-325 MG PO TABS
1.0000 | ORAL_TABLET | Freq: Four times a day (QID) | ORAL | Status: DC | PRN
  Administered 2015-10-03: 1 via ORAL
  Administered 2015-10-04: 2 via ORAL
  Administered 2015-10-05 – 2015-10-07 (×5): 1 via ORAL
  Filled 2015-10-03 (×5): qty 1
  Filled 2015-10-03: qty 2
  Filled 2015-10-03 (×2): qty 1

## 2015-10-03 MED ORDER — METOCLOPRAMIDE HCL 5 MG/ML IJ SOLN: 5.0000 mg | Freq: Three times a day (TID) | INTRAMUSCULAR | Status: DC | PRN

## 2015-10-03 MED ORDER — METOCLOPRAMIDE HCL 5 MG PO TABS: 5.0000 mg | ORAL_TABLET | Freq: Three times a day (TID) | ORAL | Status: DC | PRN

## 2015-10-03 MED ORDER — ACETAMINOPHEN 650 MG RE SUPP
650.0000 mg | Freq: Four times a day (QID) | RECTAL | Status: DC | PRN
Start: 2015-10-03 — End: 2015-10-07

## 2015-10-03 MED ORDER — ONDANSETRON HCL 4 MG/2ML IJ SOLN: 4.0000 mg | Freq: Four times a day (QID) | INTRAMUSCULAR | Status: DC | PRN

## 2015-10-03 MED ORDER — MORPHINE SULFATE (PF) 2 MG/ML IV SOLN: 0.5000 mg | INTRAVENOUS | Status: DC | PRN

## 2015-10-03 MED ORDER — ACETAMINOPHEN 325 MG PO TABS: 650.0000 mg | ORAL_TABLET | Freq: Four times a day (QID) | ORAL | Status: DC | PRN

## 2015-10-03 MED ORDER — HYDROCODONE-ACETAMINOPHEN 5-325 MG PO TABS: 1.0000 | ORAL_TABLET | Freq: Four times a day (QID) | ORAL | Status: AC | PRN

## 2015-10-03 MED ORDER — PHENOL 1.4 % MT LIQD: 1.0000 | OROMUCOSAL | Status: DC | PRN

## 2015-10-03 MED ORDER — DOCUSATE SODIUM 100 MG PO CAPS: 100.0000 mg | ORAL_CAPSULE | Freq: Two times a day (BID) | ORAL | Status: AC

## 2015-10-03 MED ORDER — MENTHOL 3 MG MT LOZG: 1.0000 | LOZENGE | OROMUCOSAL | Status: DC | PRN

## 2015-10-03 MED ORDER — HEPARIN SODIUM (PORCINE) 5000 UNIT/ML IJ SOLN: 5000.0000 [IU] | Freq: Three times a day (TID) | INTRAMUSCULAR | Status: AC

## 2015-10-03 NOTE — Progress Notes (Signed)
Patient has urinated only once since her foley was taken out this morning- patient was bladder scanned: residual was 135cc. Will continue to monitor and contact MD if need be.

## 2015-10-03 NOTE — Progress Notes (Signed)
PT Cancellation Note  Patient Details Name: Kristen Clark MRN: [id number] DOB: 10/19/1929   Cancelled Treatment:    Reason Eval/Treat Not Completed: Pain limiting ability to participate.  Patient states "I'm in too much pain!"  Attempted to initiate movement with patient, she refused any movement, complaining of pain.  Explained importance of moving out of bed.  Patient continued to decline.  Will attempt later as time allows.   Vena AustriaDavis, Jontavius Rabalais H 10/03/2015, 1:26 PM Durenda HurtSusan H. Renaldo Fiddleravis, PT, [facility]sptlM[facility] Acute Rehab Services Pager [phone]

## 2015-10-03 NOTE — Progress Notes (Signed)
[facility]  PROGRESS NOTE  Kristen Clark FXT:[id number] DOB: July 16, 1929 DOA: 10/01/2015 PCP: Velna Hatchet, MD    Brief HPI:  82 y.o. female with medical history significant for dementia on Aricept, significant protein calorie malnutrition with continued weight loss, vitamin D deficiency and osteopenia who presents to the ER after experiencing mechanical fall yesterday. Patient had been visiting a family friend along with her daughter when the family friend's dog rushed the patient knocking her legs out from under her and she fell on her left side. She was having some pain but not significant enough to warrant the [facility] medical attention. Unfortunately after waking up sweating the pain was very severe and patient decided to seek medical treatment.  Principal Problem:   Fracture of femoral neck, left (HCC) Active Problems:   Left radial fracture   Acute hyperglycemia   Osteopenia   Dementia   Severe protein-calorie malnutrition [name]: less than 60% of standard weight) (HCC)   Abnormal ECG   Vitamin D deficiency   Normocytic anemia   Pressure ulcer   Palliative care encounter   Goals of care, counseling/discussion   Hip fracture (HCC)   Assessment/Plan:  Fracture of femoral neck, left after mechanical fall -Comminuted hip fracture and patient with witnessed mechanical fall and known osteopenia and vitamin D deficiency -Orthopedic surgery plans operative intervention on 7/12   Left radial fracture -Splint applied in the ER at direction of orthopedic team   Abnormal ECG -Voltage criteria met for LVH and there was a question of cardiomegaly on the chest x-ray - Echocardiogram showed EF 60- 65%.   Acute hyperglycemia/leukocytosis -Likely related to acute stressors of pain and injury -Repeat lab in a.m. UA is negative  Osteopenia/Vitamin D deficiency -She was on vitamin D as well as calcium supplementation prior to the patient -Follow up on calcium, albumin and vitamin D  labs obtained at admission   Dementia -Continue preadmission Aricept   Severe protein-calorie malnutrition [name]: less than 60% of standard weight)  -Chronic ongoing problem and likely related to patient's advancing dementia -Was taking Ensure Plus one time daily at home -Continue nutritional supplements but increase to twice a day while acutely stressed for surgery and hospitalization -Attrition consultation   Normocytic anemia -Baseline hemoglobin unknown -On iron supplementation prior to admission -Anemia panel : B12 1316.  Leukocytosis Likely reactive, will check cbc in am.  DVT prophylaxis: Heparin Code Status: DNR Family Communication: No family at bedside Disposition Plan:    Consultants:  Orthopedics  Antibiotics:  None   Subjective: Patient seen and examined, s/p ORIF left hip, left radial fracture  repair.  Objective: Filed Vitals:   10/02/15 2153 10/03/15 0231 10/03/15 0530 10/03/15 0900  BP: 88/51 108/45 113/48 118/50  Pulse: 102 107 112 113  Temp: 97.5 F (36.4 C) 97.7 F (36.5 C) 97.8 F (36.6 C) 98.3 F (36.8 C)  TempSrc: Axillary Oral Oral Oral  Resp: 14 15 13 14   Height:      Weight:      SpO2: 99% 93% 99% 98%    Intake/Output Summary (Last 24 hours) at 10/03/15 1314 Last data filed at 10/03/15 0933  Gross per 24 hour  Intake   1800 ml  Output    800 ml  Net   1000 ml   Filed Weights   10/01/15 1835  Weight: 28.214 kg (62 lb 3.2 oz)    Examination:  General exam: Appears calm and comfortable  Respiratory system: Clear to auscultation. Respiratory effort normal. Cardiovascular system:  S1 & S2 heard, RRR. No JVD, murmurs, rubs, gallops or clicks. No pedal edema. Gastrointestinal system: Abdomen is nondistended, soft and nontender. No organomegaly or masses felt. Normal bowel sounds heard. Central nervous system: Somnolent, opens eyes to verbal stimuli. Extremities: Left upper extremity in dressing. Skin: No rashes, lesions or  ulcers    Data Reviewed: I have personally reviewed following labs and imaging studies Basic Metabolic Panel:  Recent Labs Lab 10/01/15 1244 10/01/15 1258 10/01/15 1515 10/02/15 0711  NA 138 138  --  139  K 4.2 4.2  --  4.1  CL 102 101  --  103  CO2 25  --   --  27  GLUCOSE 129* 126*  --  107*  BUN 19 20  --  24*  CREATININE 0.52 0.50  --  0.60  CALCIUM 8.4*  --  8.2* 8.3*   Liver Function Tests:  Recent Labs Lab 10/01/15 1515  ALBUMIN 3.5   CBC:  Recent Labs Lab 10/01/15 1244 10/01/15 1258 10/02/15 0711  WBC 18.9*  --  19.1*  HGB 10.1* 11.9* 9.2*  HCT 33.7* 35.0* 30.3*  MCV 88.5  --  87.1  PLT 368  --  398     Recent Results (from the past 240 hour(s))  MRSA PCR Screening     Status: None   Collection Time: 10/01/15  4:47 PM  Result Value Ref Range Status   MRSA by PCR NEGATIVE NEGATIVE Final    Comment:        The GeneXpert MRSA Assay (FDA approved for NASAL specimens only), is one component of a comprehensive MRSA colonization surveillance program. It is not intended to diagnose MRSA infection nor to guide or monitor treatment for MRSA infections.   Urine culture     Status: Abnormal   Collection Time: 10/02/15  6:14 AM  Result Value Ref Range Status   Specimen Description URINE, RANDOM  Final   Special Requests NONE  Final   Culture <10,000 COLONIES/mL INSIGNIFICANT GROWTH (A)  Final   Report Status 10/03/2015 FINAL  Final     Studies: Dg Chest 2 View  10/01/2015  CLINICAL DATA:  Fall yesterday walking the dog EXAM: CHEST  2 VIEW COMPARISON:  None. FINDINGS: Borderline cardiomegaly. Hyperinflation is noted. No infiltrate or pulmonary edema. Thoracic spine osteopenia. No gross fractures are noted. No pneumothorax. IMPRESSION: No active disease. Hyperinflation. Cardiomegaly. Thoracic spine osteopenia. No gross fractures. No pneumothorax. Electronically Signed   By: Lahoma Crocker M.D.   On: 10/01/2015 14:00   Dg Wrist 2 Views Left  10/02/2015   CLINICAL DATA:  ORIF left wrist fracture EXAM: LEFT WRIST - 2 VIEW COMPARISON:  None FLUOROSCOPY TIME:  30 seconds FINDINGS: Distal left radial metaphysis fracture transfixed with a volar side plate and multiple interlocking screws with the fracture in near anatomic alignment. No other fracture or dislocation. IMPRESSION: Interval ORIF left distal radial fracture. Electronically Signed   By: Kathreen Devoid   On: 10/02/2015 21:32   Ct Head Wo Contrast  10/01/2015  CLINICAL DATA:  82 year old female with a history of fall EXAM: CT HEAD WITHOUT CONTRAST TECHNIQUE: Contiguous axial images were obtained from the base of the skull through the vertex without intravenous contrast. COMPARISON:  None. FINDINGS: Unremarkable appearance of the calvarium without acute fracture or aggressive lesion. Unremarkable appearance of the scalp soft tissues. Unremarkable appearance of the bilateral orbits. Mastoid air cells are clear. No significant paranasal sinus disease No acute intracranial hemorrhage. No midline shift or  mass effect. Gray-white differentiation relatively maintained. Confluent hypodensity in the periventricular white matter. Mild brain volume loss. Suggesting calcifications. IMPRESSION: No CT evidence of acute intracranial abnormality. Senescent brain volume loss and chronic white matter disease. Signed, Dulcy Fanny. Earleen Newport, DO [facility] Radiology Electronically Signed   By: Corrie Mckusick D.O.   On: 10/01/2015 13:44   Pelvis Portable  10/02/2015  CLINICAL DATA:  Post left IM femoral nail. EXAM: PORTABLE PELVIS 1-2 VIEWS COMPARISON:  Intraoperative fluoroscopy 10/02/2015 FINDINGS: Postoperative changes with long intra medullary rod and compression screw fixation of the inter trochanteric fractures of the proximal left femur. The distal portion of the intra medullary rod is not included within the field of view. Visualized components appear well seated. No dislocation  at the hip joint. Subcutaneous emphysema is likely due to recent surgery. Prominent vascular calcifications are present. IMPRESSION: Intra medullary rod and compression screw fixation of intertrochanteric fracture of the proximal left femur. Electronically Signed   By: Lucienne Capers M.D.   On: 10/02/2015 21:43   Dg C-arm 1-60 Min  10/02/2015  CLINICAL DATA:  ORIF left hip fracture EXAM: DG C-ARM 61-120 MIN; LEFT FEMUR 2 VIEWS COMPARISON:  None FLUOROSCOPY TIME:  30 seconds FINDINGS: Left intertrochanteric fracture transfixed with an intramedullary nail and 2 cannulated femoral neck screws. The fracture is in near anatomic alignment. IMPRESSION: ORIF left intertrochanteric fracture. Electronically Signed   By: Kathreen Devoid   On: 10/02/2015 20:36   Dg Femur Min 2 Views Left  10/02/2015  CLINICAL DATA:  ORIF left hip fracture EXAM: DG C-ARM 61-120 MIN; LEFT FEMUR 2 VIEWS COMPARISON:  None FLUOROSCOPY TIME:  30 seconds FINDINGS: Left intertrochanteric fracture transfixed with an intramedullary nail and 2 cannulated femoral neck screws. The fracture is in near anatomic alignment. IMPRESSION: ORIF left intertrochanteric fracture. Electronically Signed   By: Kathreen Devoid   On: 10/02/2015 20:36    Scheduled Meds: .  ceFAZolin (ANCEF) IV  1 g Intravenous Q6H  . cholecalciferol  1,000 Units Oral Daily  . docusate sodium  100 mg Oral BID  . donepezil  10 mg Oral QHS  . feeding supplement (ENSURE ENLIVE)  237 mL Oral BID BM  . ferrous sulfate  325 mg Oral TID PC  . heparin  5,000 Units Subcutaneous Q8H  . multivitamin with minerals  1 tablet Oral Daily   Continuous Infusions: . 0.9 % NaCl with KCl 20 mEq / L 75 mL/hr at 10/03/15 1307  . lactated ringers Stopped (10/02/15 2029)       Time spent: 25 min    [facility] [facility] Pager [phone]. If 7PM-7AM, please contact night-coverage at [email or web address], Office  [phone]  password [email or web address] 10/03/2015, 1:14 PM  LOS: 2 days

## 2015-10-03 NOTE — Progress Notes (Signed)
Patient stable Left wrist splinted Pain controlled Dressing strike Mobilize nonweightbearing left lower extremity for 2 weeks and then return to clinic for suture removal and repeat radiographs

## 2015-10-03 NOTE — Progress Notes (Signed)
Palliative RN Note: checked on pt. She is confused, stating that she has not had surgery on her arm or hip, both of which were done yesterday. She has vague complaints of pain. CNA and Nurse Tech are both at bedside; breakfast is on the pt's tray. She is cachectic and frail-appearing. Discussed with PMT PA M York. No changes ordered.  Margret ChanceMelanie G. Cesareo Vickrey, RN, BSN, [facility] 10/03/2015 9:56 AM Cell [phone] 8:00-4:00 Monday-Friday Office [phone]

## 2015-10-03 NOTE — Op Note (Signed)
Kristen Clark, Kristen Clark NO.:  [id number]  MEDICAL RECORD NO.:  [id number]  LOCATION:  6N09C                        FACILITY:  [facility]  PHYSICIAN:  Burnard Bunting, M.D.    DATE OF BIRTH:  [birth year]  DATE OF PROCEDURE: DATE OF DISCHARGE:                              OPERATIVE REPORT   PREOPERATIVE DIAGNOSES:  Left hip high intertrochanteric fracture, left wrist displaced distal radius fracture.  POSTOPERATIVE DIAGNOSIS:  Left hip high intertrochanteric fracture, left wrist displaced distal radius fracture.  PROCEDURES: 1. Left hip intertrochanteric fracture fixation using Smith and Nephew     10 x 32 mm InterTan nail. 2. Left wrist open reduction of 3-part volar Barton distal radius type     fracture.  SURGEON:  Burnard Bunting, M.D.  ASSISTANT:  April Chilton Si, RNFA.  INDICATIONS:  Any is an 82 year old female who is ambulatory, who fell at her daughter's house.  She presents now for operative management of hip fracture and left wrist fracture.  DESCRIPTION OF PROCEDURE:  The patient was brought to the operating room where general anesthetic was induced.  Preoperative antibiotics were administered.  Time-out was called.  The patient was placed on the fracture bed with the right leg in lithotomy position.  Left leg under traction.  Under fluoroscopic guidance, the fracture was reduced with traction.  The left hip area was then prescrubbed with alcohol and Betadine and allowed to air dry.  Prepped with DuraPrep solution and draped in sterile manner.  Collier Flowers was used to cover the operative field. Guidepin was placed through the tip of the trochanter.  Under fluoroscopic guidance, proximal reaming performed in accordance with preoperative templating.  A 10 x 32 mm InterTan nail was placed with the 2 screws in the inferior half of the femoral neck for optimal bone purchase.  The screws were placed under fluoroscopic guidance through a separate incision through an  intact lateral wall.  Fracture moved as a unit, did not require distal fixation.  Two-plane fluoroscopy of the hip and the knee and the femur demonstrated good placement of hardware.  At this time, both incisions were thoroughly irrigated and closed using 0 Vicryl suture, 2-0 Vicryl suture, and 3-0 nylon.  Aquacel dressings placed.  Attention then redirected towards the left wrist.  This area also was prepped and draped, prescrubbed with alcohol and Betadine, and allowed to air dry, prepped with DuraPrep solution, draped in sterile manner.  Collier Flowers was used to cover the operative field.  Time-out was called.  Arm Esmarch utilized approximately 36 minutes at 250 mmHg. Incision was made at the distal wrist flexion crease, extending proximally along the FCR tendon for about 7 cm.  Skin and subcu tissues were sharply divided.  The FCR tendon was mobilized radially along with the radial artery and its veins.  Fracture was identified.  Pronator quadratus was elevated off the fracture site.  The fracture site was visualized and reduced.  A [employer] plate was then applied and a good reduction was achieved.  Locking pegs placed distally.  Locking and nonlocking screws placed proximally.  Maximum screws were used in order to facilitate some weightbearing through the  arm because of her hip fracture.  At this time, thorough irrigation was performed.  Tourniquet was released.  Bleeding points were encountered and controlled using bipolar electrocautery.  Skin closed using 3-0 Vicryl, 3-0 nylon.  Well-padded splint applied.  The patient tolerated the procedure well without immediate complications, transferred to the recovery room in stable condition.     Burnard BuntingG. Scott Shaun Runyon, M.D.     GSD/MEDQ  D:  10/02/2015  T:  10/03/2015  Job:  [id number]

## 2015-10-04 DIAGNOSIS — D62 Acute posthemorrhagic anemia: Secondary | ICD-10-CM | POA: Insufficient documentation

## 2015-10-04 LAB — CBC AND DIFFERENTIAL
HCT: 27 % — AB (ref 36–46)
HEMOGLOBIN: 7.9 g/dL — AB (ref 12.0–16.0)
Platelets: 333 10*3/uL (ref 150–399)
WBC: 19.4 10^3/mL

## 2015-10-04 LAB — CBC
HEMATOCRIT: 26.6 % — AB (ref 36.0–46.0)
HEMOGLOBIN: 7.9 g/dL — AB (ref 12.0–15.0)
MCH: 26.5 pg (ref 26.0–34.0)
MCHC: 29.7 g/dL — ABNORMAL LOW (ref 30.0–36.0)
MCV: 89.3 fL (ref 78.0–100.0)
Platelets: 333 10*3/uL (ref 150–400)
RBC: 2.98 MIL/uL — ABNORMAL LOW (ref 3.87–5.11)
RDW: 17.2 % — AB (ref 11.5–15.5)
WBC: 19.4 10*3/uL — AB (ref 4.0–10.5)

## 2015-10-04 LAB — BASIC METABOLIC PANEL
ANION GAP: 11 (ref 5–15)
BUN: 20 mg/dL (ref 4–21)
BUN: 20 mg/dL (ref 6–20)
CHLORIDE: 103 mmol/L (ref 101–111)
CO2: 25 mmol/L (ref 22–32)
CREATININE: 0.4 mg/dL — AB (ref 0.5–1.1)
Calcium: 7.7 mg/dL — ABNORMAL LOW (ref 8.9–10.3)
Creatinine, Ser: 0.38 mg/dL — ABNORMAL LOW (ref 0.44–1.00)
GFR calc Af Amer: >60 mL/min (ref >60)
GFR calc non Af Amer: >60 mL/min (ref >60)
Glucose, Bld: 116 mg/dL — ABNORMAL HIGH (ref 65–99)
Glucose: 116 mg/dL
POTASSIUM: 4.1 mmol/L (ref 3.4–5.3)
POTASSIUM: 4.1 mmol/L (ref 3.5–5.1)
Sodium: 139 mmol/L (ref 135–145)
Sodium: 139 mmol/L (ref 137–147)

## 2015-10-04 LAB — PREPARE RBC (CROSSMATCH)

## 2015-10-04 LAB — PROTIME-INR
INR: 1.27 (ref 0.00–1.49)
PROTHROMBIN TIME: 16.1 s — AB (ref 11.6–15.2)

## 2015-10-04 MED ORDER — FUROSEMIDE 10 MG/ML IJ SOLN
20.0000 mg | Freq: Two times a day (BID) | INTRAMUSCULAR | Status: AC
  Administered 2015-10-04: 20 mg via INTRAVENOUS
  Filled 2015-10-04 (×2): qty 2

## 2015-10-04 MED ORDER — SODIUM CHLORIDE 0.9 % IV SOLN
Freq: Once | INTRAVENOUS | Status: AC
  Administered 2015-10-04: 13:00:00 via INTRAVENOUS

## 2015-10-04 MED ORDER — MENTHOL 3 MG MT LOZG: 1.0000 | LOZENGE | OROMUCOSAL | Status: DC | PRN

## 2015-10-04 MED ORDER — GUAIFENESIN ER 600 MG PO TB12
1200.0000 mg | ORAL_TABLET | Freq: Two times a day (BID) | ORAL | Status: DC
  Administered 2015-10-04 – 2015-10-07 (×7): 1200 mg via ORAL
  Filled 2015-10-04 (×7): qty 2

## 2015-10-04 MED ORDER — HYDROCOD POLST-CPM POLST ER 10-8 MG/5ML PO SUER
5.0000 mL | Freq: Every day | ORAL | Status: AC
  Administered 2015-10-04: 5 mL via ORAL
  Filled 2015-10-04: qty 5

## 2015-10-04 MED ORDER — ALBUTEROL SULFATE (2.5 MG/3ML) 0.083% IN NEBU
2.5000 mg | INHALATION_SOLUTION | RESPIRATORY_TRACT | Status: DC | PRN
  Administered 2015-10-06: 2.5 mg via RESPIRATORY_TRACT
  Filled 2015-10-04 (×2): qty 3

## 2015-10-04 NOTE — Care Management Important Message (Signed)
Important Message  Patient Details  Name: Kristen Clark MRN: [id number] Date of Birth: 08/17/1929   Medicare Important Message Given:  Yes    [name] 10/04/2015, 11:47 AM

## 2015-10-04 NOTE — Evaluation (Signed)
Physical Therapy Evaluation Patient Details Name: Kristen Clark MRN: [id number] DOB: Apr 01, 1929 Today's Date: 10/04/2015   History of Present Illness  Pt s/p fall with resulting L hip and wrist fx. Underwent ORIF L hip and L wrist 7/12. medical history significant for dementia on Aricept, significant protein calorie malnutrition with continued weight loss, vitamin D deficiency and osteopenia .   Clinical Impression  Patient presents with problems listed below.  Will benefit from acute PT to maximize functional mobility prior to d/c.  Per patient, she was ambulatory pta (unsure of accuracy).  Required +2 assist to move patient to EOB today.  Recommend SNF at discharge for continued therapy.    Follow Up Recommendations SNF;Supervision/Assistance - 24 hour    Equipment Recommendations  Wheelchair (measurements PT);Wheelchair cushion (measurements PT)    Recommendations for Other Services       Precautions / Restrictions Precautions Precautions: Fall Required Braces or Orthoses: Other Brace/Splint (post-op Lt wrist splint) Restrictions Weight Bearing Restrictions: Yes LUE Weight Bearing: Non weight bearing LLE Weight Bearing: Non weight bearing      Mobility  Bed Mobility Overal bed mobility: Needs Assistance;+2 for physical assistance Bed Mobility: Supine to Sit;Sit to Supine     Supine to sit: +2 for physical assistance;Total assist Sit to supine: +2 for physical assistance;Total assist   General bed mobility comments: Pt did not participate in bed mobility. Used bed pad to move pt to EOB.  Once upright, patient required min assist to maintain balance, with occasional short periods of min guard assist.  Posterior lean.  Transfers                 General transfer comment: not attempted this session  Ambulation/Gait                Stairs            Wheelchair Mobility    Modified Rankin (Stroke Patients Only)       Balance Overall balance  assessment: Needs assistance Sitting-balance support: Single extremity supported;Feet unsupported Sitting balance-Leahy Scale: Poor Sitting balance - Comments: Required assist to maintain static balance, losing balance posteriorly Postural control: Posterior lean                                   Pertinent Vitals/Pain Pain Assessment: Faces Faces Pain Scale: Hurts little more Pain Location: unsure Pain Descriptors / Indicators: Grimacing;Moaning ("I'm in pain") Pain Intervention(s): Limited activity within patient's tolerance;Monitored during session;Repositioned    Home Living Family/patient expects to be discharged to:: Skilled nursing facility Living Arrangements: Children                    Prior Function           Comments: unsure of PLOF. Pt unable to give information. States she lives with her daughter     Hand Dominance   Dominant Hand: Right    Extremity/Trunk Assessment   Upper Extremity Assessment: Defer to OT evaluation       LUE Deficits / Details: L forearm post op splint. Able to wiggle.move L digits.    Lower Extremity Assessment: Generalized weakness;LLE deficits/detail   LLE Deficits / Details: Decreased strength/ROM due to pain  Cervical / Trunk Assessment: Kyphotic  Communication   Communication: HOH  Cognition Arousal/Alertness: Awake/alert Behavior During Therapy: Anxious Overall Cognitive Status: No family/caregiver present to determine baseline cognitive functioning (dementia.unsure of baseline level of  funcitoning.)       Memory: Decreased recall of precautions;Decreased short-term memory              General Comments      Exercises        Assessment/Plan    PT Assessment Patient needs continued PT services  PT Diagnosis Difficulty walking;Generalized weakness;Acute pain;Altered mental status   PT Problem List Decreased strength;Decreased activity tolerance;Decreased balance;Decreased  mobility;Decreased cognition;Decreased knowledge of use of DME;Decreased knowledge of precautions;Pain  PT Treatment Interventions DME instruction;Functional mobility training;Therapeutic activities;Therapeutic exercise;Balance training;Patient/family education   PT Goals (Current goals can be found in the Care Plan section) Acute Rehab PT Goals Patient Stated Goal: No goals stated.  Patient did talk about wanting to go dancing again. PT Goal Formulation: With patient Time For Goal Achievement: 10/18/15 Potential to Achieve Goals: Good    Frequency Min 6X/week   Barriers to discharge        Co-evaluation PT/OT/SLP Co-Evaluation/Treatment: Yes Reason for Co-Treatment: Necessary to address cognition/behavior during functional activity;For patient/therapist safety PT goals addressed during session: Mobility/safety with mobility OT goals addressed during session: ADL's and self-care       End of Session   Activity Tolerance: Patient limited by pain (Self-limited) Patient left: in bed;with call bell/phone within reach;with bed alarm set Nurse Communication: Mobility status         Time: [id number] PT Time Calculation (min) (ACUTE ONLY): 25 min   Charges:   PT Evaluation $PT Eval Moderate Complexity: 1 Procedure     PT G CodesVena Austria:        Cassie Shedlock H 10/04/2015, 6:35 PM Durenda HurtSusan H. Renaldo Fiddleravis, PT, [facility] Acute Rehab Services Pager [phone]

## 2015-10-04 NOTE — Progress Notes (Signed)
[facility]  PROGRESS NOTE  Kristen Clark BSJ:[id number] DOB: 29-Sep-1929 DOA: 10/01/2015 PCP: Velna Hatchet, MD    Brief HPI:  82 y.o. female with medical history significant for dementia on Aricept, significant protein calorie malnutrition with continued weight loss, vitamin D deficiency and osteopenia who presents to the ER after experiencing mechanical fall yesterday. Patient had been visiting a family friend along with her daughter when the family friend's dog rushed the patient knocking her legs out from under her and she fell on her left side. She was having some pain but not significant enough to warrant the [facility] medical attention. Unfortunately after waking up sweating the pain was very severe and patient decided to seek medical treatment.  Principal Problem:   Fracture of femoral neck, left (HCC) Active Problems:   Left radial fracture   Acute hyperglycemia   Osteopenia   Dementia   Severe protein-calorie malnutrition [name]: less than 60% of standard weight) (HCC)   Abnormal ECG   Vitamin D deficiency   Normocytic anemia   Pressure ulcer   Palliative care encounter   Goals of care, counseling/discussion   Hip fracture (HCC)   Assessment/Plan:  Fracture of femoral neck, left after mechanical fall, s/p ORIF -Comminuted hip fracture and patient with witnessed mechanical fall and known osteopenia and vitamin D deficiency   Left radial fracture -Splint applied in the ER at direction of orthopedic team   Abnormal ECG -Voltage criteria met for LVH and there was a question of cardiomegaly on the chest x-ray - Echocardiogram showed EF 60- 65%.   Acute hyperglycemia/leukocytosis -Likely related to acute stressors of pain and injury -Repeat lab in a.m. UA is negative  Osteopenia/Vitamin D deficiency -She was on vitamin D as well as calcium supplementation prior to the patient -Follow up on calcium, albumin and vitamin D labs obtained at admission    Dementia -Continue preadmission Aricept   Severe protein-calorie malnutrition [name]: less than 60% of standard weight)  -Chronic ongoing problem and likely related to patient's advancing dementia -Was taking Ensure Plus one time daily at home -Continue nutritional supplements but increase to twice a day while acutely stressed for surgery and hospitalization -Attrition consultation   Normocytic anemia - today hemoglobin is 7.9 - will transfuse one unit PRBC and check cbc in am -Baseline hemoglobin unknown -On iron supplementation prior to admission -Anemia panel : B12 1316.  Leukocytosis Likely reactive, will check cbc in am.  DVT prophylaxis: Heparin Code Status: DNR Family Communication: No family at bedside Disposition Plan:    Consultants:  Orthopedics  Antibiotics:  None   Subjective: Patient seen and examined, s/p ORIF left hip, left radial fracture  Repair.Lethargic this morning, hemoglobin is 7.9   Objective: Filed Vitals:   10/03/15 1510 10/03/15 1824 10/03/15 2113 10/04/15 0700  BP: 95/44 114/46 112/50 133/49  Pulse: 96 100 100 99  Temp: 99 F (37.2 C) 98.6 F (37 C) 98.1 F (36.7 C) 98.1 F (36.7 C)  TempSrc: Oral Oral Oral Oral  Resp: _0 Height:      Weight:      SpO2: 99% 98% 98% 98%   No intake or output data in the 24 hours ending 10/04/15 1128 Filed Weights   10/01/15 1835  Weight: 28.214 kg (62 lb 3.2 oz)    Examination:  General exam: Appears calm and comfortable  Respiratory system: Clear to auscultation. Respiratory effort normal. Cardiovascular system: S1 & S2 heard, RRR. No JVD, murmurs, rubs, gallops or clicks.  No pedal edema. Gastrointestinal system: Abdomen is nondistended, soft and nontender. No organomegaly or masses felt. Normal bowel sounds heard. Central nervous system: Somnolent, opens eyes to verbal stimuli. Extremities: Left upper extremity in dressing. Skin: No rashes, lesions or ulcers    Data  Reviewed: I have personally reviewed following labs and imaging studies Basic Metabolic Panel:  Recent Labs Lab 10/01/15 1244 10/01/15 1258 10/01/15 1515 10/02/15 0711 10/04/15 0414  NA 138 138  --  139 139  K 4.2 4.2  --  4.1 4.1  CL 102 101  --  103 103  CO2 25  --   --  27 25  GLUCOSE 129* 126*  --  107* 116*  BUN 19 20  --  24* 20  CREATININE 0.52 0.50  --  0.60 0.38*  CALCIUM 8.4*  --  8.2* 8.3* 7.7*   Liver Function Tests:  Recent Labs Lab 10/01/15 1515  ALBUMIN 3.5   CBC:  Recent Labs Lab 10/01/15 1244 10/01/15 1258 10/02/15 0711 10/04/15 0414  WBC 18.9*  --  19.1* 19.4*  HGB 10.1* 11.9* 9.2* 7.9*  HCT 33.7* 35.0* 30.3* 26.6*  MCV 88.5  --  87.1 89.3  PLT 368  --  398 333     Recent Results (from the past 240 hour(s))  MRSA PCR Screening     Status: None   Collection Time: 10/01/15  4:47 PM  Result Value Ref Range Status   MRSA by PCR NEGATIVE NEGATIVE Final    Comment:        The GeneXpert MRSA Assay (FDA approved for NASAL specimens only), is one component of a comprehensive MRSA colonization surveillance program. It is not intended to diagnose MRSA infection nor to guide or monitor treatment for MRSA infections.   Urine culture     Status: Abnormal   Collection Time: 10/02/15  6:14 AM  Result Value Ref Range Status   Specimen Description URINE, RANDOM  Final   Special Requests NONE  Final   Culture <10,000 COLONIES/mL INSIGNIFICANT GROWTH (A)  Final   Report Status 10/03/2015 FINAL  Final     Studies: Dg Wrist 2 Views Left  10/02/2015  CLINICAL DATA:  ORIF left wrist fracture EXAM: LEFT WRIST - 2 VIEW COMPARISON:  None FLUOROSCOPY TIME:  30 seconds FINDINGS: Distal left radial metaphysis fracture transfixed with a volar side plate and multiple interlocking screws with the fracture in near anatomic alignment. No other fracture or dislocation. IMPRESSION: Interval ORIF left distal radial fracture. Electronically Signed   By: Kathreen Devoid    On: 10/02/2015 21:32   Pelvis Portable  10/02/2015  CLINICAL DATA:  Post left IM femoral nail. EXAM: PORTABLE PELVIS 1-2 VIEWS COMPARISON:  Intraoperative fluoroscopy 10/02/2015 FINDINGS: Postoperative changes with long intra medullary rod and compression screw fixation of the inter trochanteric fractures of the proximal left femur. The distal portion of the intra medullary rod is not included within the field of view. Visualized components appear well seated. No dislocation at the hip joint. Subcutaneous emphysema is likely due to recent surgery. Prominent vascular calcifications are present. IMPRESSION: Intra medullary rod and compression screw fixation of intertrochanteric fracture of the proximal left femur. Electronically Signed   By: Lucienne Capers M.D.   On: 10/02/2015 21:43   Dg C-arm 1-60 Min  10/02/2015  CLINICAL DATA:  ORIF left hip fracture EXAM: DG C-ARM 61-120 MIN; LEFT FEMUR 2 VIEWS COMPARISON:  None FLUOROSCOPY TIME:  30 seconds FINDINGS: Left intertrochanteric fracture transfixed with an  intramedullary nail and 2 cannulated femoral neck screws. The fracture is in near anatomic alignment. IMPRESSION: ORIF left intertrochanteric fracture. Electronically Signed   By: Kathreen Devoid   On: 10/02/2015 20:36   Dg Femur Min 2 Views Left  10/02/2015  CLINICAL DATA:  ORIF left hip fracture EXAM: DG C-ARM 61-120 MIN; LEFT FEMUR 2 VIEWS COMPARISON:  None FLUOROSCOPY TIME:  30 seconds FINDINGS: Left intertrochanteric fracture transfixed with an intramedullary nail and 2 cannulated femoral neck screws. The fracture is in near anatomic alignment. IMPRESSION: ORIF left intertrochanteric fracture. Electronically Signed   By: Kathreen Devoid   On: 10/02/2015 20:36    Scheduled Meds: . sodium chloride   Intravenous Once  . cholecalciferol  1,000 Units Oral Daily  . docusate sodium  100 mg Oral BID  . donepezil  10 mg Oral QHS  . feeding supplement (ENSURE ENLIVE)  237 mL Oral BID BM  . ferrous sulfate   325 mg Oral TID PC  . furosemide  20 mg Intravenous Q12H  . guaiFENesin  1,200 mg Oral BID  . heparin  5,000 Units Subcutaneous Q8H  . multivitamin with minerals  1 tablet Oral Daily   Continuous Infusions: . lactated ringers Stopped (10/02/15 2029)       Time spent: 25 min    [facility] Pager [phone]. If 7PM-7AM, please contact night-coverage at [email or web address], Office  [phone]  password [email or web address] 10/04/2015, 11:28 AM  LOS: 3 days

## 2015-10-04 NOTE — Progress Notes (Addendum)
Daily Progress Note   Patient Name: Kristen Clark       Date: 10/04/2015 DOB: 05/27/1929  Age: 82 y.o. MRN#: [id number] Attending Physician: Meredeth IdeGagan S Lama, MD Primary Care Physician: Alysia PennaHOLWERDA, SCOTT, MD Admit Date: 10/01/2015  Reason for Consultation/Follow-up: Non pain symptom management and Psychosocial/spiritual support  Subjective: Patient tells me she is in no pain.  She does not know she has had surgery.  When I ask where we are she responds "You know we are at the breakfast place".  Per bedside RN, Verlee MonteDora, her mental status is improving (she's speaking today), and she is eating well.  But she refuses to feed herself (full feeding assistance) and she is refusing physical therapy.  I spoke with Clydie BraunKaren, her daughter, on the phone to update her on my concern for her mother.  We discussed the potential for her to quickly become bedbound and dependent.  Length of Stay: 3  Current Medications: Scheduled Meds:  . cholecalciferol  1,000 Units Oral Daily  . docusate sodium  100 mg Oral BID  . donepezil  10 mg Oral QHS  . feeding supplement (ENSURE ENLIVE)  237 mL Oral BID BM  . ferrous sulfate  325 mg Oral TID PC  . heparin  5,000 Units Subcutaneous Q8H  . multivitamin with minerals  1 tablet Oral Daily    Continuous Infusions: . 0.9 % NaCl with KCl 20 mEq / L 75 mL/hr at 10/04/15 0646  . lactated ringers Stopped (10/02/15 2029)    PRN Meds: acetaminophen **OR** acetaminophen, haloperidol lactate, HYDROcodone-acetaminophen, HYDROmorphone (DILAUDID) injection, LORazepam, menthol-cetylpyridinium **OR** phenol, metoCLOPramide **OR** metoCLOPramide (REGLAN) injection, morphine injection, morphine injection, ondansetron **OR** ondansetron (ZOFRAN) IV  Physical Exam   Very frail, awake, propped  up in bed.  Pleasantly confused CV tachy and irregular (working hard) Resp:  Some coarse sounds, no increased work of breathing Abdomen:  Firm, mildly distended, NT         Vital Signs: BP 133/49 mmHg  Pulse 99  Temp(Src) 98.1 F (36.7 C) (Oral)  Resp 18  Ht 4\' 7"  (1.397 m)  Wt 28.214 kg (62 lb 3.2 oz)  BMI 14.46 kg/m2  SpO2 98% SpO2: SpO2: 98 % O2 Device: O2 Device: Nasal Cannula O2 Flow Rate: O2 Flow Rate (L/min): 2 L/min  Intake/output summary: No intake or output data  in the 24 hours ending 10/04/15 1107 LBM:   Baseline Weight: Weight: 28.214 kg (62 lb 3.2 oz) Most recent weight: Weight: 28.214 kg (62 lb 3.2 oz)       Palliative Assessment/Data:    Flowsheet Rows        Most Recent Value   Intake Tab    Referral Department  Hospitalist   Unit at Time of Referral  Med/Surg Unit   Palliative Care Primary Diagnosis  Sepsis/Infectious Disease   Date Notified  10/01/15   Palliative Care Type  New Palliative care   Reason for referral  Clarify Goals of Care   Date of Admission  10/01/15   Date first seen by Palliative Care  10/02/15   # of days Palliative referral response time  1 Day(s)   # of days IP prior to Palliative referral  0   Clinical Assessment    Palliative Performance Scale Score  30%   Psychosocial & Spiritual Assessment    Palliative Care Outcomes    Patient/Family meeting held?  Yes   Who was at the meeting?  dtr Clydie Braun and patient   Palliative Care Outcomes  Clarified goals of care   Other Treatment Preference Instructions  completed MOST form.      Patient Active Problem List   Diagnosis Date Noted  . Pressure ulcer 10/02/2015  . Hip fracture (HCC) 10/02/2015  . Palliative care encounter   . Goals of care, counseling/discussion   . Left radial fracture 10/01/2015  . Acute hyperglycemia 10/01/2015  . Osteopenia 10/01/2015  . Fracture of femoral neck, left (HCC) 10/01/2015  . Femoral neck fracture, left, closed, initial encounter 10/01/2015    . Dementia 10/01/2015  . Severe protein-calorie malnutrition Lily Kocher: less than 60% of standard weight) (HCC) 10/01/2015  . Abnormal ECG 10/01/2015  . Vitamin D deficiency 10/01/2015  . Normocytic anemia 10/01/2015    Palliative Care Assessment & Plan   Patient Profile:  82 y.o. female with past medical history of dementia, insomnia, anxiety admitted on 10/01/2015 with left femoral neck fracture, and left radial fracture.   Assessment: Now s/p left hip nailing and left radial reduction.  Patient is confused and dependent for all ADLs.  Not participating in PT.  Hgb at 7.9.  Patient weak and tachycardic.    Recommendations/Plan:  DNR.  SNF.    Please request Palliative outpatient follow up at SNF in D/C summary  Discussed case with Dr. Sharl Ma who felt that a unit of blood and lasix may be helpful given post op anemia.  Discussed with physical therapy and daughter. All are attempting to mobilize patient.   Goals of Care and Additional Recommendations:  Limitations on Scope of Treatment: Full Scope Treatment with DNR/DNI  Code Status: DNR.  Most form is in place.  Prognosis:  Unable to determine.  If she mobilizes she has the ability to make a full recovery.  Otherwise likely less than 6 months.  Discharge Planning:  Skilled Nursing Facility for rehab with Palliative care service follow-up  Care plan was discussed with Bedside RN, PT, [facility] Attending and dtr.  Thank you for allowing the Palliative Medicine Team to assist in the care of this patient.   Time In: 11:00 Time Out: 11:45 Total Time 45 Prolonged Time Billed no      Greater than 50%  of this time was spent counseling and coordinating care related to the above assessment and plan.  Algis Downs, [place] [facility] Pager: [phone]  Please contact Palliative  Medicine Team phone at [phone] for questions and concerns.

## 2015-10-04 NOTE — Clinical Social Work Note (Signed)
CSW continuing to follow patient's progress, awaiting PT eval for recommendations.  Ervin KnackEric R. Gokul Waybright, MSW, Theresia MajorsLCSWA [phone] 10/04/2015 6:06 PM

## 2015-10-04 NOTE — Progress Notes (Signed)
Occupational Therapy Evaluation Patient Details Name: Kristen Clark MRN: [id number] DOB: 06/23/1929 Today's Date: 10/04/2015    History of Present Illness Pt s/p fall with resulting L hip and wrist fx. Underwent ORIF L hip and L wrist 7/12. medical history significant for dementia on Aricept, significant protein calorie malnutrition with continued weight loss, vitamin D deficiency and osteopenia .    Clinical Impression   Unsure of PLOF. Pt states she lives with her daughter. Pt aparrently ambulatory PTA. Total A +2 to move pt to sit EOB. After sitting EOB for @ 5 min, pt more alert and talkative. Pt drinking tea and attempting to feed self, although had difficulty chewing/swallowing a peach. Increased coughing. nsg tech aware and soft diet ordered for dinner - noted pt does not have dentures (unsure if she wears these) Pt asked for a "caffeine free pepsi or a beer". Pt then telling therapists stories about meeting her husband, smiling and laughing and said "it feels good to laugh". Pt unaware that she had hip surgery. Pt will need rehab at SNF. OT will follow to address established goals.    Follow Up Recommendations  SNF;Supervision/Assistance - 24 hour    Equipment Recommendations  Other (comment) (TBA at SNF)    Recommendations for Other Services  Speech consult for swallowing assessment      Precautions / Restrictions Precautions Precautions: Fall Required Braces or Orthoses: Other Brace/Splint (postop L wrist splint) Restrictions Weight Bearing Restrictions: Yes LUE Weight Bearing: Non weight bearing LLE Weight Bearing: Non weight bearing      Mobility Bed Mobility Overal bed mobility: Needs Assistance;+2 for physical assistance Bed Mobility: Supine to Sit;Sit to Supine     Supine to sit: +2 for physical assistance;Total assist Sit to supine: +2 for physical assistance;Total assist   General bed mobility comments: Pt did not participate in bed mobility. Used bed pad to  move pt to EOB  Transfers                 General transfer comment: not attempted this session    Balance Overall balance assessment: Needs assistance   Sitting balance-Leahy Scale: Poor Sitting balance - Comments: posterior lean                                    ADL Overall ADL's : Needs assistance/impaired Eating/Feeding: Minimal assistance;Cueing for safety Eating/Feeding Details (indicate cue type and reason): unable to swallow peach; able to hold and drink tea Grooming: Moderate assistance   Upper Body Bathing: Moderate assistance;Sitting   Lower Body Bathing: Total assistance                       Functional mobility during ADLs:  (sat EOB with min A) General ADL Comments: Pt with increased alertness after sitting EOB @ 5 min .     Vision Additional Comments: unsure of visual status   Perception     Praxis      Pertinent Vitals/Pain Pain Assessment: Faces Faces Pain Scale: Hurts little more Pain Location: unsure Pain Descriptors / Indicators: Grimacing;Moaning Pain Intervention(s): Limited activity within patient's tolerance     Hand Dominance Right   Extremity/Trunk Assessment Upper Extremity Assessment Upper Extremity Assessment: LUE deficits/detail;Generalized weakness LUE Deficits / Details: L forearm post op splint. Able to wiggle.move L digits.    Lower Extremity Assessment Lower Extremity Assessment: Defer to PT evaluation;Generalized weakness   Cervical /  Trunk Assessment Cervical / Trunk Assessment: Kyphotic   Communication Communication Communication: HOH   Cognition Arousal/Alertness: Awake/alert Behavior During Therapy: Anxious Overall Cognitive Status: No family/caregiver present to determine baseline cognitive functioning (dementia.unsure of baseline level of funcitoning.)       Memory: Decreased recall of precautions;Decreased short-term memory             General Comments       Exercises        Shoulder Instructions      Home Living Family/patient expects to be discharged to:: Skilled nursing facility Living Arrangements: Children                                      Prior Functioning/Environment          Comments: unsure of PLOF. Pt unable to give information. States she lives with her daughter    OT Diagnosis: Generalized weakness;Cognitive deficits;Acute pain   OT Problem List: Decreased strength;Decreased range of motion;Decreased activity tolerance;Impaired balance (sitting and/or standing);Decreased safety awareness;Decreased knowledge of use of DME or AE;Decreased knowledge of precautions;Cardiopulmonary status limiting activity;Impaired UE functional use;Pain;Increased edema   OT Treatment/Interventions: Self-care/ADL training;Therapeutic exercise;Energy conservation;DME and/or AE instruction;Therapeutic activities;Cognitive remediation/compensation;Patient/family education;Balance training    OT Goals(Current goals can be found in the care plan section) Acute Rehab OT Goals Patient Stated Goal: pt likes to dance OT Goal Formulation: Patient unable to participate in goal setting Time For Goal Achievement: 10/18/15 Potential to Achieve Goals: Fair ADL Goals Pt Will Perform Eating: sitting;with set-up;with caregiver independent in assisting Pt Will Perform Grooming: with set-up;with supervision;sitting;bed level Pt Will Perform Upper Body Bathing: with set-up;with supervision;with caregiver independent in assisting;bed level Pt Will Transfer to Toilet: with +2 assist;bedside commode;squat pivot transfer;with max assist  OT Frequency: Min 2X/week   Barriers to D/C:            Co-evaluation PT/OT/SLP Co-Evaluation/Treatment: Yes Reason for Co-Treatment: Necessary to address cognition/behavior during functional activity;For patient/therapist safety   OT goals addressed during session: ADL's and self-care      End of Session Nurse  Communication: Mobility status  Activity Tolerance: Patient tolerated treatment well Patient left: in bed;with call bell/phone within reach;with bed alarm set   Time: 1455-1518 OT Time Calculation (min): 23 min Charges:  OT General Charges $OT Visit: 1 Procedure OT Evaluation $OT Eval Moderate Complexity: 1 Procedure G-Codes:    Mykal Kirchman,HILLARY 10-16-2015, 3:41 PM   [facility], OTR/L  [phone] 10-16-2015

## 2015-10-05 DIAGNOSIS — D6489 Other specified anemias: Secondary | ICD-10-CM

## 2015-10-05 DIAGNOSIS — R9431 Abnormal electrocardiogram [ECG] [EKG]: Secondary | ICD-10-CM

## 2015-10-05 LAB — COMPREHENSIVE METABOLIC PANEL
ALBUMIN: 2.3 g/dL — AB (ref 3.5–5.0)
ALK PHOS: 116 U/L (ref 38–126)
ALT: 16 U/L (ref 14–54)
AST: 21 U/L (ref 15–41)
Anion gap: 11 (ref 5–15)
BUN: 24 mg/dL — AB (ref 6–20)
CHLORIDE: 103 mmol/L (ref 101–111)
CO2: 28 mmol/L (ref 22–32)
Calcium: 8 mg/dL — ABNORMAL LOW (ref 8.9–10.3)
Creatinine, Ser: 0.5 mg/dL (ref 0.44–1.00)
GFR calc non Af Amer: >60 mL/min (ref >60)
GLUCOSE: 109 mg/dL — AB (ref 65–99)
Potassium: 3.7 mmol/L (ref 3.5–5.1)
SODIUM: 142 mmol/L (ref 135–145)
Total Bilirubin: 1.6 mg/dL — ABNORMAL HIGH (ref 0.3–1.2)
Total Protein: 5.2 g/dL — ABNORMAL LOW (ref 6.5–8.1)

## 2015-10-05 LAB — TYPE AND SCREEN
ABO/RH(D): A POS
Antibody Screen: NEGATIVE
Unit division: 0

## 2015-10-05 LAB — CBC
HCT: 34.4 % — ABNORMAL LOW (ref 36.0–46.0)
HEMOGLOBIN: 10.8 g/dL — AB (ref 12.0–15.0)
MCH: 27.3 pg (ref 26.0–34.0)
MCHC: 31.4 g/dL (ref 30.0–36.0)
MCV: 86.9 fL (ref 78.0–100.0)
Platelets: 427 10*3/uL — ABNORMAL HIGH (ref 150–400)
RBC: 3.96 MIL/uL (ref 3.87–5.11)
RDW: 17.8 % — ABNORMAL HIGH (ref 11.5–15.5)
WBC: 18.2 10*3/uL — AB (ref 4.0–10.5)

## 2015-10-05 LAB — CBC AND DIFFERENTIAL
HEMATOCRIT: 34 % — AB (ref 36–46)
HEMOGLOBIN: 10.8 g/dL — AB (ref 12.0–16.0)
Platelets: 427 10*3/uL — AB (ref 150–399)
WBC: 18.2 10^3/mL

## 2015-10-05 LAB — BASIC METABOLIC PANEL
BUN: 24 mg/dL — AB (ref 4–21)
Creatinine: 0.5 mg/dL (ref 0.5–1.1)
Glucose: 109 mg/dL
Potassium: 3.7 mmol/L (ref 3.4–5.3)
SODIUM: 142 mmol/L (ref 137–147)

## 2015-10-05 LAB — HEPATIC FUNCTION PANEL: BILIRUBIN, TOTAL: 1.6 mg/dL

## 2015-10-05 LAB — PROTIME-INR
INR: 1.12 (ref 0.00–1.49)
PROTHROMBIN TIME: 14.6 s (ref 11.6–15.2)

## 2015-10-05 MED ORDER — SODIUM CHLORIDE 0.9 % IV SOLN: INTRAVENOUS | Status: DC

## 2015-10-05 MED ORDER — SODIUM CHLORIDE 0.9 % IV SOLN
INTRAVENOUS | Status: DC
  Administered 2015-10-05: 17:00:00 via INTRAVENOUS

## 2015-10-05 NOTE — [place] FL2 (Signed)
[place] MEDICAID FL2 LEVEL OF CARE SCREENING TOOL     IDENTIFICATION  Patient Name: Kristen Clark Birthdate: [birth year] Sex: female Admission Date (Current Location): 10/01/2015  [facility] and [place] Number:  Producer, television/film/video and Address:  [facility] [place], [address], [place], [place] [address]      Provider Number: [id number]  Attending Physician Name and Address:  Meredeth Ide, MD  Relative Name and Phone Number:       Current Level of Care: Hospital Recommended Level of Care: Skilled Nursing Facility Prior Approval Number:    Date Approved/Denied:   PASRR Number:   [id number]   Discharge Plan: SNF    Current Diagnoses: Patient Active Problem List   Diagnosis Date Noted  . Anemia due to other cause   . Pressure ulcer 10/02/2015  . Hip fracture (HCC) 10/02/2015  . Palliative care encounter   . Goals of care, counseling/discussion   . Left radial fracture 10/01/2015  . Acute hyperglycemia 10/01/2015  . Osteopenia 10/01/2015  . Fracture of femoral neck, left (HCC) 10/01/2015  . Femoral neck fracture, left, closed, initial encounter 10/01/2015  . Dementia 10/01/2015  . Severe protein-calorie malnutrition Lily Kocher: less than 60% of standard weight) (HCC) 10/01/2015  . Abnormal ECG 10/01/2015  . Vitamin D deficiency 10/01/2015  . Normocytic anemia 10/01/2015    Orientation RESPIRATION BLADDER Height & Weight     Self  Normal Incontinent Weight: 62 lb 3.2 oz (28.214 kg) Height:   (139.7 cm)  BEHAVIORAL SYMPTOMS/MOOD NEUROLOGICAL BOWEL NUTRITION STATUS   (None)  (none) Continent Diet (regular)  AMBULATORY STATUS COMMUNICATION OF NEEDS Skin   Extensive Assist Verbally Surgical wounds                       Personal Care Assistance Level of Assistance  Bathing, Feeding, Dressing Bathing Assistance: Limited assistance Feeding assistance: Independent Dressing Assistance: Limited assistance     Functional Limitations Info   Sight, Hearing, Speech Sight Info: Adequate Hearing Info: Impaired Speech Info: Adequate    SPECIAL CARE FACTORS FREQUENCY  PT (By licensed PT)     PT Frequency:  (6x/week)              Contractures Contractures Info: Not present    Additional Factors Info  Code Status, Allergies Code Status Info:  (DNR) Allergies Info:  (DNR)           Current Medications (10/05/2015):  This is the current hospital active medication list Current Facility-Administered Medications  Medication Dose Route Frequency Provider Last Rate Last Dose  . 0.9 %  sodium chloride infusion   Intravenous Continuous Meredeth Ide, MD      . acetaminophen (TYLENOL) tablet 650 mg  650 mg Oral Q6H PRN Cammy Copa, MD       Or  . acetaminophen (TYLENOL) suppository 650 mg  650 mg Rectal Q6H PRN Cammy Copa, MD      . albuterol (PROVENTIL) (2.5 MG/3ML) 0.083% nebulizer solution 2.5 mg  2.5 mg Nebulization Q2H PRN Stephani Police, PA-C      . cholecalciferol (VITAMIN D) tablet 1,000 Units  1,000 Units Oral Daily Russella Dar, NP   1,000 Units at 10/05/15 1104  . docusate sodium (COLACE) capsule 100 mg  100 mg Oral BID Russella Dar, NP   100 mg at 10/05/15 1104  . donepezil (ARICEPT) tablet 10 mg  10 mg Oral QHS Russella Dar, NP  10 mg at 10/04/15 2105  . feeding supplement (ENSURE ENLIVE) (ENSURE ENLIVE) liquid 237 mL  237 mL Oral BID BM Russella DarAllison L Ellis, NP   237 mL at 10/05/15 1136  . ferrous sulfate tablet 325 mg  325 mg Oral TID PC Russella DarAllison L Ellis, NP   325 mg at 10/05/15 1104  . guaiFENesin (MUCINEX) 12 hr tablet 1,200 mg  1,200 mg Oral BID Stephani PoliceMarianne L York, PA-C   1,200 mg at 10/05/15 1104  . haloperidol lactate (HALDOL) injection 5 mg  5 mg Intravenous QHS PRN Ozella Rocksavid J Merrell, MD      . heparin injection 5,000 Units  5,000 Units Subcutaneous Q8H Russella DarAllison L Ellis, NP   5,000 Units at 10/05/15 0528  . HYDROcodone-acetaminophen (NORCO/VICODIN) 5-325 MG per tablet 1-2 tablet  1-2 tablet  Oral Q6H PRN Cammy CopaScott Gregory Dean, MD   1 tablet at 10/05/15 0654  . HYDROmorphone (DILAUDID) injection 0.5 mg  0.5 mg Intravenous Q2H PRN Leta BaptistEmily Roe Nguyen, MD   0.5 mg at 10/04/15 0641  . lactated ringers infusion   Intravenous Continuous Cammy CopaScott Gregory Dean, MD   Stopped at 10/02/15 2029  . LORazepam (ATIVAN) injection 0.05-1 mg  0.05-1 mg Intravenous Q8H PRN Ozella Rocksavid J Merrell, MD      . menthol-cetylpyridinium (CEPACOL) lozenge 3 mg  1 lozenge Oral PRN Cammy CopaScott Gregory Dean, MD       Or  . phenol (CHLORASEPTIC) mouth spray 1 spray  1 spray Mouth/Throat PRN Cammy CopaScott Gregory Dean, MD      . metoCLOPramide (REGLAN) tablet 5-10 mg  5-10 mg Oral Q8H PRN Cammy CopaScott Gregory Dean, MD       Or  . metoCLOPramide (REGLAN) injection 5-10 mg  5-10 mg Intravenous Q8H PRN Cammy CopaScott Gregory Dean, MD      . morphine 2 MG/ML injection 0.5 mg  0.5 mg Intravenous Q2H PRN Russella DarAllison L Ellis, NP      . morphine 2 MG/ML injection 0.5 mg  0.5 mg Intravenous Q2H PRN Cammy CopaScott Gregory Dean, MD      . multivitamin with minerals tablet 1 tablet  1 tablet Oral Daily Russella DarAllison L Ellis, NP   1 tablet at 10/05/15 1104  . ondansetron (ZOFRAN) tablet 4 mg  4 mg Oral Q6H PRN Cammy CopaScott Gregory Dean, MD       Or  . ondansetron [facility](ZOFRAN) injection 4 mg  4 mg Intravenous Q6H PRN Cammy CopaScott Gregory Dean, MD         Discharge Medications: Please see discharge summary for a list of discharge medications.  Relevant Imaging Results:  Relevant Lab Results:   Additional Information SS#: [id number]  Terald Sleeperakiyah T Selena Swaminathan, LCSW

## 2015-10-05 NOTE — Clinical Social Work Placement (Addendum)
   CLINICAL SOCIAL WORK PLACEMENT  NOTE  Date:  10/05/2015  Patient Details  Name: Kristen Clark MRN: [id number] Date of Birth: 09/09/1929  Clinical Social Work is seeking post-discharge placement for this patient at the Skilled  Nursing Facility level of care (*CSW will initial, date and re-position this form in  chart as items are completed):  Yes   Patient/family provided with [place] Clinical Social Work Department's list of facilities offering this level of care within the geographic area requested by the patient (or if unable, by the patient's family).  Yes   Patient/family informed of their freedom to choose among providers that offer the needed level of care, that participate in Medicare, Medicaid or managed care program needed by the patient, have an available bed and are willing to accept the patient.  Yes   Patient/family informed of [facility] ownership interest [facility] and [facility], as well as of the fact that they are under no obligation to receive care at these facilities.  PASRR submitted to EDS on 10/05/15     PASRR number received on 10/05/15     Existing PASRR number confirmed on       FL2 transmitted to all facilities in geographic area requested by pt/family on 10/05/15     FL2 transmitted to all facilities within larger geographic area on       Patient informed that his/her managed care company has contracts with or will negotiate with certain facilities, including the following:         10-07-15   Patient/family informed of bed offers received.  (Updated Windell MouldingEric Nancyann Cotterman, MSW, [place], 10-07-15)  Patient chooses bed at  [facility] (Updated Windell MouldingEric Jontavia Leatherbury, MSW, [place]CSWA, 10-07-15)    Physician recommends and patient chooses bed at      [facility]  10[facility]-15 (Updated Windell MouldingEric Jessee Newnam, MSW, [place]CSWA, 10-07-15).  Patient to be transferred to facility by  [facility] (Updated Windell MouldingEric Kariana Wiles, MSW, [place]WA, 10-07-15)    Patient family  notified on  10-07-15 of transfer.(Updated Windell MouldingEric Jonalyn Sedlak, MSW, [place]CSWA, 10-07-15)  Name of family member notified:   Kristen Clark patient's daughter (Updated Windell Mouldingric Perri Aragones, MSW, [place]CSWA, 10-07-15)     PHYSICIAN Please prepare priority discharge summary, including medications, Please prepare prescriptions, Please sign FL2, Please sign DNR     Additional Comment:    _______________________________________________ Terald Sleeperakiyah T Lee, LCSW 10/05/2015, 12:03 PM   Ervin KnackEric R. Hassan Rowannterhaus, MSW, Theresia MajorsLCSWA [phone] 10/07/2015 1:37 PM (Updated Windell MouldingEric Kasmira Cacioppo, MSW, [place]CSWA, 10-07-15)

## 2015-10-05 NOTE — Progress Notes (Signed)
[facility]  PROGRESS NOTE  Kristen Clark NOI:[id number] DOB: Aug 23, 1929 DOA: 10/01/2015 PCP: Velna Hatchet, MD    Brief HPI:  82 y.o. female with medical history significant for dementia on Aricept, significant protein calorie malnutrition with continued weight loss, vitamin D deficiency and osteopenia who presents to the ER after experiencing mechanical fall yesterday. Patient had been visiting a family friend along with her daughter when the family friend's dog rushed the patient knocking her legs out from under her and she fell on her left side. She was having some pain but not significant enough to warrant the [facility] medical attention. Unfortunately after waking up sweating the pain was very severe and patient decided to seek medical treatment.  Principal Problem:   Fracture of femoral neck, left (HCC) Active Problems:   Left radial fracture   Acute hyperglycemia   Osteopenia   Dementia   Severe protein-calorie malnutrition [name]: less than 60% of standard weight) (HCC)   Abnormal ECG   Vitamin D deficiency   Normocytic anemia   Pressure ulcer   Palliative care encounter   Goals of care, counseling/discussion   Hip fracture (HCC)   Anemia due to other cause   Assessment/Plan:  Fracture of femoral neck, left radius fracture after mechanical fall, s/p ORIF -Comminuted hip fracture and patient with witnessed mechanical fall and known osteopenia and vitamin D deficiency    Abnormal ECG -Voltage criteria met for LVH and there was a question of cardiomegaly on the chest x-ray - Echocardiogram showed EF 60- 65%.   Acute hyperglycemia/leukocytosis -Likely related to acute stressors of pain and injury -UA is negative, she is afebrile. - Check CBC in a.m.  Osteopenia/Vitamin D deficiency -She was on vitamin D as well as calcium supplementation prior to the patient -Follow up on calcium, albumin and vitamin D labs obtained at admission   Dementia -Continue preadmission  Aricept   Severe protein-calorie malnutrition [name]: less than 60% of standard weight)  -Chronic ongoing problem and likely related to patient's advancing dementia -Was taking Ensure Plus one time daily at home -Continue nutritional supplements but increase to twice a day while acutely stressed for surgery and hospitalization -Attrition consultation   Normocytic anemia - today hemoglobin is 10.8 - Status post transfusion of  one unit PRBC and check cbc in am -Baseline hemoglobin unknown -On iron supplementation prior to admission -Anemia panel : B12 1316.  Leukocytosis Likely reactive, will check cbc in am.  DVT prophylaxis: Heparin Code Status: DNR Family Communication: No family at bedside Disposition Plan:    Consultants:  Orthopedics  Antibiotics:  None   Subjective: Patient seen and examined, s/p ORIF left hip, left radial fracture was transfused 1 unit PRBC, hemoglobin is 10.8 this morning. Continues to complain of pain in the hand.  Objective: Filed Vitals:   10/04/15 1442 10/04/15 1615 10/04/15 2006 10/05/15 0641  BP: 98/43 107/60 114/69 93/59  Pulse: 119 113 99 122  Temp: 98.2 F (36.8 C) 98.8 F (37.1 C) 97.6 F (36.4 C) 97.6 F (36.4 C)  TempSrc: Oral Oral Oral Oral  Resp: 21 18 19 19   Height:      Weight:      SpO2: 90% 94% 95% 95%    Intake/Output Summary (Last 24 hours) at 10/05/15 1256 Last data filed at 10/05/15 0900  Gross per 24 hour  Intake    590 ml  Output      0 ml  Net    590 ml   [employer]  10/01/15 1835  Weight: 28.214 kg (62 lb 3.2 oz)    Examination:  General exam: Appears calm and comfortable  Respiratory system: Clear to auscultation. Respiratory effort normal. Cardiovascular system: S1 & S2 heard, RRR. No JVD, murmurs, rubs, gallops or clicks. No pedal edema. Gastrointestinal system: Abdomen is nondistended, soft and nontender. No organomegaly or masses felt. Normal bowel sounds heard. Central nervous system:  Somnolent, opens eyes to verbal stimuli. Extremities: Left upper extremity in dressing. Skin: No rashes, lesions or ulcers    Data Reviewed: I have personally reviewed following labs and imaging studies Basic Metabolic Panel:  Recent Labs Lab 10/01/15 1244 10/01/15 1258 10/01/15 1515 10/02/15 0711 10/04/15 0414 10/05/15 0423  NA 138 138  --  139 139 142  K 4.2 4.2  --  4.1 4.1 3.7  CL 102 101  --  103 103 103  CO2 25  --   --  27 25 28   GLUCOSE 129* 126*  --  107* 116* 109*  BUN 19 20  --  24* 20 24*  CREATININE 0.52 0.50  --  0.60 0.38* 0.50  CALCIUM 8.4*  --  8.2* 8.3* 7.7* 8.0*   Liver Function Tests:  Recent Labs Lab 10/01/15 1515 10/05/15 0423  AST  --  21  ALT  --  16  ALKPHOS  --  116  BILITOT  --  1.6*  PROT  --  5.2*  ALBUMIN 3.5 2.3*   CBC:  Recent Labs Lab 10/01/15 1244 10/01/15 1258 10/02/15 0711 10/04/15 0414 10/05/15 0423  WBC 18.9*  --  19.1* 19.4* 18.2*  HGB 10.1* 11.9* 9.2* 7.9* 10.8*  HCT 33.7* 35.0* 30.3* 26.6* 34.4*  MCV 88.5  --  87.1 89.3 86.9  PLT 368  --  398 333 427*     Recent Results (from the past 240 hour(s))  MRSA PCR Screening     Status: None   Collection Time: 10/01/15  4:47 PM  Result Value Ref Range Status   MRSA by PCR NEGATIVE NEGATIVE Final    Comment:        The GeneXpert MRSA Assay (FDA approved for NASAL specimens only), is one component of a comprehensive MRSA colonization surveillance program. It is not intended to diagnose MRSA infection nor to guide or monitor treatment for MRSA infections.   Urine culture     Status: Abnormal   Collection Time: 10/02/15  6:14 AM  Result Value Ref Range Status   Specimen Description URINE, RANDOM  Final   Special Requests NONE  Final   Culture <10,000 COLONIES/mL INSIGNIFICANT GROWTH (A)  Final   Report Status 10/03/2015 FINAL  Final     Studies: No results found.  Scheduled Meds: . cholecalciferol  1,000 Units Oral Daily  . docusate sodium  100 mg Oral  BID  . donepezil  10 mg Oral QHS  . feeding supplement (ENSURE ENLIVE)  237 mL Oral BID BM  . ferrous sulfate  325 mg Oral TID PC  . guaiFENesin  1,200 mg Oral BID  . heparin  5,000 Units Subcutaneous Q8H  . multivitamin with minerals  1 tablet Oral Daily   Continuous Infusions: . sodium chloride    . lactated ringers Stopped (10/02/15 2029)       Time spent: 25 min    [facility] Pager [phone]. If 7PM-7AM, please contact night-coverage at [email or web address], Office  [phone]  password [email or web address] 10/05/2015, 12:56 PM  LOS: 4 days

## 2015-10-05 NOTE — Clinical Social Work Note (Signed)
Clinical Social Work Assessment  Patient Details  Name: Kristen Clark MRN: [id number] Date of Birth: January 18, 1930  Date of referral:  10/05/15               Reason for consult:  Discharge Planning                Permission sought to share information with:  Case Manager, Facility Sport and exercise psychologist, Family Supports Permission granted to share information::  No  Name::        Agency::     Relationship::     Contact Information:     Housing/Transportation Living arrangements for the past 2 months:  Single Family Home Source of Information:  Medical Team Patient Interpreter Needed:  None Criminal Activity/Legal Involvement Pertinent to Current Situation/Hospitalization:  No - Comment as needed Significant Relationships:  Adult Children Lives with:  Self Do you feel safe going back to the place where you live?  No Need for family participation in patient care:  Yes (Comment)  Care giving concerns:Pt confused and unable to participate in assessment.    Social Worker assessment / plan: Holiday representative met with pt to discuss CSW role with discharge planning. Pt very confused and unable to participate in assessment. CSW contacted pt daughter and explained PT recommendation for SNF for short-term rehab. Pt daughter agreeable to SNF for higher level of care. Pt daughter requested [facility] or [facility] for SNF.    CSW will continue to assist pt with discharge needs and provide family with bed offers once available.   Employment status:  Retired Forensic scientist:  Medicare PT Recommendations:  [place] / Referral to community resources:  [place]  Patient/Family's Response to care:Pt confused and unable to participate in assessment. Family not at pt bedside during interview. Pt's daughter understands SNF recommendation and appears happy with care pt is receiving at [facility].   Patient/Family's Understanding of and Emotional  Response to Diagnosis, Current Treatment, and Prognosis: Pt daughter appears to have good understanding of reason for pt admission into hospital and pt care plan.   Emotional Assessment Appearance:  Appears stated age Attitude/Demeanor/Rapport:  Unable to Assess Affect (typically observed):  Unable to Assess Orientation:  Oriented to Self Alcohol / Substance use:  Not Applicable Psych involvement (Current and /or in the community):  No (Comment)  Discharge Needs  Concerns to be addressed:  Discharge Planning Concerns Readmission within the last 30 days:    Current discharge risk:  None Barriers to Discharge:  Continued Medical Work up   [employer], LCSW 10/05/2015, 10:08 AM

## 2015-10-05 NOTE — Progress Notes (Signed)
Subjective: 3 Days Post-Op Procedure(s) (LRB): INTRAMEDULLARY (IM) NAIL FEMORAL (Left) OPEN REDUCTION INTERNAL FIXATION (ORIF) WRIST FRACTURE (Left) Patient reports pain as moderate.    Objective: Vital signs in last 24 hours: Temp:  [97.5 F (36.4 C)-98.8 F (37.1 C)] 97.6 F (36.4 C) (07/15 0641) Pulse Rate:  [99-122] 122 (07/15 0641) Resp:  [18-21] 19 (07/15 0641) BP: (90-114)/(43-69) 93/59 mmHg (07/15 0641) SpO2:  [90 %-97 %] 95 % (07/15 0641)  Intake/Output from previous day: 07/14 0701 - 07/15 0700 In: 590 [P.O.:240; Blood:350] Out: -  Intake/Output this shift:     Recent Labs  10/04/15 0414 10/05/15 0423  HGB 7.9* 10.8*    Recent Labs  10/04/15 0414 10/05/15 0423  WBC 19.4* 18.2*  RBC 2.98* 3.96  HCT 26.6* 34.4*  PLT 333 427*    Recent Labs  10/04/15 0414 10/05/15 0423  NA 139 142  K 4.1 3.7  CL 103 103  CO2 25 28  BUN 20 24*  CREATININE 0.38* 0.50  GLUCOSE 116* 109*  CALCIUM 7.7* 8.0*    Recent Labs  10/04/15 0414 10/05/15 0423  INR 1.27 1.12    dressing dry  Assessment/Plan: 3 Days Post-Op Procedure(s) (LRB): INTRAMEDULLARY (IM) NAIL FEMORAL (Left) OPEN REDUCTION INTERNAL FIXATION (ORIF) WRIST FRACTURE (Left)   PLAN:      SNF  Krissia Schreier C 10/05/2015, 9:42 AM

## 2015-10-06 LAB — CBC
HCT: 32.9 % — ABNORMAL LOW (ref 36.0–46.0)
Hemoglobin: 10.1 g/dL — ABNORMAL LOW (ref 12.0–15.0)
MCH: 26.9 pg (ref 26.0–34.0)
MCHC: 30.7 g/dL (ref 30.0–36.0)
MCV: 87.7 fL (ref 78.0–100.0)
PLATELETS: 387 10*3/uL (ref 150–400)
RBC: 3.75 MIL/uL — ABNORMAL LOW (ref 3.87–5.11)
RDW: 17.1 % — AB (ref 11.5–15.5)
WBC: 15.3 10*3/uL — ABNORMAL HIGH (ref 4.0–10.5)

## 2015-10-06 LAB — BASIC METABOLIC PANEL
Anion gap: 8 (ref 5–15)
BUN: 27 mg/dL — AB (ref 4–21)
BUN: 27 mg/dL — AB (ref 6–20)
CALCIUM: 7.5 mg/dL — AB (ref 8.9–10.3)
CO2: 27 mmol/L (ref 22–32)
CREATININE: 0.35 mg/dL — AB (ref 0.44–1.00)
Chloride: 106 mmol/L (ref 101–111)
Creatinine: 0.4 mg/dL — AB (ref 0.5–1.1)
GLUCOSE: 91 mg/dL
Glucose, Bld: 91 mg/dL (ref 65–99)
Potassium: 3.4 mmol/L (ref 3.4–5.3)
Potassium: 3.4 mmol/L — ABNORMAL LOW (ref 3.5–5.1)
SODIUM: 141 mmol/L (ref 135–145)
Sodium: 141 mmol/L (ref 137–147)

## 2015-10-06 LAB — CBC AND DIFFERENTIAL: WBC: 15.3 10*3/mL

## 2015-10-06 LAB — PROTIME-INR
INR: 1.12 (ref 0.00–1.49)
PROTHROMBIN TIME: 14.6 s (ref 11.6–15.2)

## 2015-10-06 MED ORDER — POTASSIUM CHLORIDE CRYS ER 20 MEQ PO TBCR
40.0000 meq | EXTENDED_RELEASE_TABLET | Freq: Once | ORAL | Status: AC
  Administered 2015-10-06: 40 meq via ORAL
  Filled 2015-10-06: qty 2

## 2015-10-06 MED ORDER — SODIUM CHLORIDE 0.9 % IV SOLN: INTRAVENOUS | Status: AC

## 2015-10-06 NOTE — Progress Notes (Signed)
[facility]  PROGRESS NOTE  Kristen Clark DTO:[id number] DOB: [birth year] DOA: 10/01/2015 PCP: Velna Hatchet, MD    Brief HPI:  82 y.o. female with medical history significant for dementia on Aricept, significant protein calorie malnutrition with continued weight loss, vitamin D deficiency and osteopenia who presents to the ER after experiencing mechanical fall yesterday. Patient had been visiting a family friend along with her daughter when the family friend's dog rushed the patient knocking her legs out from under her and she fell on her left side. She was having some pain but not significant enough to warrant the [facility] medical attention. Unfortunately after waking up sweating the pain was very severe and patient decided to seek medical treatment.  Principal Problem:   Fracture of femoral neck, left (HCC) Active Problems:   Left radial fracture   Acute hyperglycemia   Osteopenia   Dementia   Severe protein-calorie malnutrition Kristen Clark: less than 60% of standard weight) (HCC)   Abnormal ECG   Vitamin D deficiency   Normocytic anemia   Pressure ulcer   Palliative care encounter   Goals of care, counseling/discussion   Hip fracture (HCC)   Anemia due to other cause   Assessment/Plan:  Fracture of femoral neck, left radius fracture after mechanical fall, s/p ORIF -Comminuted hip fracture and patient with witnessed mechanical fall and known osteopenia and vitamin D deficiency  Abnormal ECG -Voltage criteria met for LVH and there was a question of cardiomegaly on the chest x-ray - Echocardiogram showed EF 60- 65%.  Leukocytosis improving -Likely related to acute stressors of pain and injury -UA is negative, she is afebrile. - Check CBC in a.m.  Osteopenia/Vitamin D deficiency -She was on vitamin D as well as calcium supplementation prior to the patient -Follow up on calcium, albumin and vitamin D labs obtained at admission   Dementia -Continue preadmission Aricept    Severe protein-calorie malnutrition Kristen Clark: less than 60% of standard weight)  -Chronic ongoing problem and likely related to patient's advancing dementia -Was taking Ensure Plus one time daily at home -Continue nutritional supplements but increase to twice a day while acutely stressed for surgery and hospitalization -Attrition consultation   Normocytic anemia - today hemoglobin is 10.8 - Status post transfusion of  one unit PRBC and check cbc in am -Baseline hemoglobin unknown -On iron supplementation prior to admission -Anemia panel : B12 1316.  Hypokalemia Replace potassium and check bmp in am.    DVT prophylaxis: Heparin Code Status: DNR Family Communication: No family at bedside Disposition Plan:    Consultants:  Orthopedics  Antibiotics:  None   Subjective: Patient seen and examined, s/p ORIF left hip, left radial fracture was transfused 1 unit PRBC, hemoglobin is 10.1  Objective: Filed Vitals:   10/05/15 0641 10/05/15 1349 10/05/15 2119 10/06/15 0650  BP: 93/59 111/57 92/53 104/52  Pulse: 122 124 115 106  Temp: 97.6 F (36.4 C) 98.6 F (37 C) 98.1 F (36.7 C) 98.2 F (36.8 C)  TempSrc: Oral Oral Oral   Resp: _0 Height:      Weight:      SpO2: 95% 93% 95% 95%    Intake/Output Summary (Last 24 hours) at 10/06/15 1235 Last data filed at 10/06/15 1000  Gross per 24 hour  Intake 856.25 ml  Output      0 ml  Net 856.25 ml   Filed Weights   10/01/15 1835  Weight: 28.214 kg (62 lb 3.2 oz)    Examination:  General  exam: Appears calm and comfortable  Respiratory system: Clear to auscultation. Respiratory effort normal. Cardiovascular system: S1 & S2 heard, RRR. No JVD, murmurs, rubs, gallops or clicks. No pedal edema. Gastrointestinal system: Abdomen is nondistended, soft and nontender. No organomegaly or masses felt. Normal bowel sounds heard. Central nervous system: Somnolent, opens eyes to verbal stimuli. Extremities: Left upper  extremity in dressing. Skin: No rashes, lesions or ulcers    Data Reviewed: I have personally reviewed following labs and imaging studies Basic Metabolic Panel:  Recent Labs Lab 10/01/15 1244 10/01/15 1258 10/01/15 1515 10/02/15 0711 10/04/15 0414 10/05/15 0423 10/06/15 0423  NA 138 138  --  139 139 142 141  K 4.2 4.2  --  4.1 4.1 3.7 3.4*  CL 102 101  --  103 103 103 106  CO2 25  --   --  _0 GLUCOSE 129* 126*  --  107* 116* 109* 91  BUN 19 20  --  24* 20 24* 27*  CREATININE 0.52 0.50  --  0.60 0.38* 0.50 0.35*  CALCIUM 8.4*  --  8.2* 8.3* 7.7* 8.0* 7.5*   Liver Function Tests:  Recent Labs Lab 10/01/15 1515 10/05/15 0423  AST  --  21  ALT  --  16  ALKPHOS  --  116  BILITOT  --  1.6*  PROT  --  5.2*  ALBUMIN 3.5 2.3*   CBC:  Recent Labs Lab 10/01/15 1244 10/01/15 1258 10/02/15 0711 10/04/15 0414 10/05/15 0423 10/06/15 0423  WBC 18.9*  --  19.1* 19.4* 18.2* 15.3*  HGB 10.1* 11.9* 9.2* 7.9* 10.8* 10.1*  HCT 33.7* 35.0* 30.3* 26.6* 34.4* 32.9*  MCV 88.5  --  87.1 89.3 86.9 87.7  PLT 368  --  398 333 427* 387     Recent Results (from the past 240 hour(s))  MRSA PCR Screening     Status: None   Collection Time: 10/01/15  4:47 PM  Result Value Ref Range Status   MRSA by PCR NEGATIVE NEGATIVE Final    Comment:        The GeneXpert MRSA Assay (FDA approved for NASAL specimens only), is one component of a comprehensive MRSA colonization surveillance program. It is not intended to diagnose MRSA infection nor to guide or monitor treatment for MRSA infections.   Urine culture     Status: Abnormal   Collection Time: 10/02/15  6:14 AM  Result Value Ref Range Status   Specimen Description URINE, RANDOM  Final   Special Requests NONE  Final   Culture <10,000 COLONIES/mL INSIGNIFICANT GROWTH (A)  Final   Report Status 10/03/2015 FINAL  Final     Studies: No results found.  Scheduled Meds: . cholecalciferol  1,000 Units Oral Daily  .  docusate sodium  100 mg Oral BID  . donepezil  10 mg Oral QHS  . feeding supplement (ENSURE ENLIVE)  237 mL Oral BID BM  . ferrous sulfate  325 mg Oral TID PC  . guaiFENesin  1,200 mg Oral BID  . heparin  5,000 Units Subcutaneous Q8H  . multivitamin with minerals  1 tablet Oral Daily   Continuous Infusions: . sodium chloride 75 mL/hr at 10/06/15 0955  . lactated ringers Stopped (10/02/15 2029)       Time spent: 25 min    [facility] Pager [phone]. If 7PM-7AM, please contact night-coverage at [email or web address], Office  [phone]  password [email or web address] 10/06/2015, 12:35 PM  LOS: 5 days

## 2015-10-07 DIAGNOSIS — D6489 Other specified anemias: Secondary | ICD-10-CM | POA: Diagnosis not present

## 2015-10-07 DIAGNOSIS — F0390 Unspecified dementia without behavioral disturbance: Secondary | ICD-10-CM | POA: Diagnosis not present

## 2015-10-07 DIAGNOSIS — F418 Other specified anxiety disorders: Secondary | ICD-10-CM | POA: Diagnosis not present

## 2015-10-07 DIAGNOSIS — S5292XA Unspecified fracture of left forearm, initial encounter for closed fracture: Secondary | ICD-10-CM | POA: Diagnosis not present

## 2015-10-07 DIAGNOSIS — E43 Unspecified severe protein-calorie malnutrition: Secondary | ICD-10-CM

## 2015-10-07 DIAGNOSIS — D649 Anemia, unspecified: Secondary | ICD-10-CM | POA: Diagnosis not present

## 2015-10-07 DIAGNOSIS — K5901 Slow transit constipation: Secondary | ICD-10-CM | POA: Diagnosis not present

## 2015-10-07 DIAGNOSIS — S72002D Fracture of unspecified part of neck of left femur, subsequent encounter for closed fracture with routine healing: Secondary | ICD-10-CM | POA: Diagnosis not present

## 2015-10-07 DIAGNOSIS — S5290XA Unspecified fracture of unspecified forearm, initial encounter for closed fracture: Secondary | ICD-10-CM | POA: Diagnosis not present

## 2015-10-07 DIAGNOSIS — E46 Unspecified protein-calorie malnutrition: Secondary | ICD-10-CM | POA: Diagnosis not present

## 2015-10-07 DIAGNOSIS — S72002A Fracture of unspecified part of neck of left femur, initial encounter for closed fracture: Secondary | ICD-10-CM | POA: Diagnosis not present

## 2015-10-07 DIAGNOSIS — E559 Vitamin D deficiency, unspecified: Secondary | ICD-10-CM | POA: Diagnosis not present

## 2015-10-07 DIAGNOSIS — S5292XS Unspecified fracture of left forearm, sequela: Secondary | ICD-10-CM | POA: Diagnosis not present

## 2015-10-07 DIAGNOSIS — M84758D Complete oblique atypical femoral fracture, left leg, subsequent encounter for fracture with routine healing: Secondary | ICD-10-CM | POA: Diagnosis not present

## 2015-10-07 DIAGNOSIS — S5292XD Unspecified fracture of left forearm, subsequent encounter for closed fracture with routine healing: Secondary | ICD-10-CM | POA: Diagnosis not present

## 2015-10-07 DIAGNOSIS — F0150 Vascular dementia without behavioral disturbance: Secondary | ICD-10-CM | POA: Diagnosis not present

## 2015-10-07 DIAGNOSIS — S72009A Fracture of unspecified part of neck of unspecified femur, initial encounter for closed fracture: Secondary | ICD-10-CM | POA: Diagnosis not present

## 2015-10-07 DIAGNOSIS — D62 Acute posthemorrhagic anemia: Secondary | ICD-10-CM | POA: Diagnosis not present

## 2015-10-07 DIAGNOSIS — G308 Other Alzheimer's disease: Secondary | ICD-10-CM | POA: Diagnosis not present

## 2015-10-07 DIAGNOSIS — S52512A Displaced fracture of left radial styloid process, initial encounter for closed fracture: Secondary | ICD-10-CM | POA: Diagnosis not present

## 2015-10-07 LAB — BASIC METABOLIC PANEL
Anion gap: 4 — ABNORMAL LOW (ref 5–15)
BUN: 20 mg/dL (ref 4–21)
BUN: 20 mg/dL (ref 6–20)
CHLORIDE: 107 mmol/L (ref 101–111)
CO2: 26 mmol/L (ref 22–32)
CREATININE: 0.35 mg/dL — AB (ref 0.44–1.00)
Calcium: 7.4 mg/dL — ABNORMAL LOW (ref 8.9–10.3)
Creatinine: 0.4 mg/dL — AB (ref 0.5–1.1)
GFR calc Af Amer: >60 mL/min (ref >60)
GFR calc non Af Amer: >60 mL/min (ref >60)
Glucose, Bld: 97 mg/dL (ref 65–99)
Glucose: 97 mg/dL
POTASSIUM: 4.3 mmol/L (ref 3.4–5.3)
Potassium: 4.3 mmol/L (ref 3.5–5.1)
SODIUM: 137 mmol/L (ref 137–147)
Sodium: 137 mmol/L (ref 135–145)

## 2015-10-07 LAB — PROTIME-INR
INR: 1.11 (ref 0.00–1.49)
Prothrombin Time: 14.5 seconds (ref 11.6–15.2)

## 2015-10-07 MED ORDER — ONDANSETRON HCL 4 MG PO TABS: 4.0000 mg | ORAL_TABLET | Freq: Four times a day (QID) | ORAL | Status: AC | PRN

## 2015-10-07 MED ORDER — SERTRALINE HCL 50 MG PO TABS
25.0000 mg | ORAL_TABLET | Freq: Every day | ORAL | Status: DC
  Administered 2015-10-07: 25 mg via ORAL
  Filled 2015-10-07: qty 1

## 2015-10-07 MED ORDER — ENSURE ENLIVE PO LIQD: 237.0000 mL | Freq: Two times a day (BID) | ORAL | Status: DC

## 2015-10-07 NOTE — Care Management Important Message (Signed)
Important Message  Patient Details  Name: Kristen Clark MRN: [id number] Date of Birth: 11/02/1929   Medicare Important Message Given:  Yes    Bernadette HoitShoffner, Kaely Hollan Coleman 10/07/2015, 3:10 PM

## 2015-10-07 NOTE — Progress Notes (Signed)
Physician Discharge Summary  Kristen Clark SNK:[id number] DOB: [birth year] DOA: 10/01/2015  PCP: Kristen Hatchet, MD  Admit date: 10/01/2015 Discharge date: 10/07/2015  Time spent: 25 minutes  Recommendations for Outpatient Follow-up:  1. Follow up Orthopedics in 2 weeks   Discharge Diagnoses:  Principal Problem:   Fracture of femoral neck, left (HCC) Active Problems:   Left radial fracture   Acute hyperglycemia   Osteopenia   Dementia   Severe protein-calorie malnutrition [name]: less than 60% of standard weight) (HCC)   Abnormal ECG   Vitamin D deficiency   Normocytic anemia   Pressure ulcer   Palliative care encounter   Goals of care, counseling/discussion   Hip fracture ([name])   Anemia due to other cause   Discharge Condition: Stable  Diet recommendation: Regular  Filed Weights   10/01/15 1835  Weight: 28.214 kg (62 lb 3.2 oz)    History of present illness:  82 y.o. female with medical history significant for dementia on Aricept, significant protein calorie malnutrition with continued weight loss, vitamin D deficiency and osteopenia who presents to the ER after experiencing mechanical fall yesterday. Patient had been visiting a family friend along with her daughter when the family friend's dog rushed the patient knocking her legs out from under her and she fell on her left side. She was having some pain but not significant enough to warrant the [facility] medical attention. Unfortunately after waking up sweating the pain was very severe and patient decided to seek medical treatment.   Hospital Course:   Fracture of femoral neck, left radius fracture after mechanical fall, s/p ORIF -Comminuted hip fracture and patient with witnessed mechanical fall and known osteopenia and vitamin D deficiency  Abnormal ECG -Voltage criteria met for LVH and there was a question of cardiomegaly on the chest x-ray - Echocardiogram showed EF 60- 65%.  Leukocytosis Improved, wbc is 15000 -Likely  related to acute stressors of pain and injury -UA is negative, she is afebrile.   Osteopenia/Vitamin D deficiency -She was on vitamin D as well as calcium supplementation prior to the patient -Follow up on calcium, albumin and vitamin D labs obtained at admission   Dementia -Continue preadmission Aricept   Severe protein-calorie malnutrition [name]: less than 60% of standard weight)  -Chronic ongoing problem and likely related to patient's advancing dementia -Was taking Ensure Plus one time daily at home -Continue nutritional supplements but increase to twice a day while acutely stressed for surgery and hospitalization -Attrition consultation   Normocytic anemia - today hemoglobin is 10.8 - Status post transfusion of one unit PRBC  -Baseline hemoglobin unknown -On iron supplementation prior to admission -Anemia panel : B12 1316.    Procedures:  ORIF  Consultations:  Orthopedics  Discharge Exam: Filed Vitals:   10/06/15 2032 10/07/15 0451  BP: 106/54 134/89  Pulse: 121 103  Temp: 98 F (36.7 C) 98.2 F (36.8 C)  Resp: 16 21    General: Appears in no acute distress Cardiovascular: S1S2 RRR Respiratory: Clear bilaterally  Discharge Instructions   Discharge Instructions    Diet - low sodium heart healthy    Complete by:  As directed      Increase activity slowly    Complete by:  As directed      Non weight bearing    Complete by:  As directed           Current Discharge Medication List    START taking these medications   Details  docusate sodium (COLACE) 100  MG capsule Take 1 capsule (100 mg total) by mouth 2 (two) times daily. Qty: 10 capsule, Refills: 0    heparin 5000 UNIT/ML injection Inject 1 mL (5,000 Units total) into the skin every 8 (eight) hours. Qty: 1 mL, Refills: 90    HYDROcodone-acetaminophen (NORCO/VICODIN) 5-325 MG tablet Take 1-2 tablets by mouth every 6 (six) hours as needed for moderate pain. Qty: 30 tablet, Refills: 0     ondansetron (ZOFRAN) 4 MG tablet Take 1 tablet (4 mg total) by mouth every 6 (six) hours as needed for nausea. Qty: 20 tablet, Refills: 0      CONTINUE these medications which have NOT CHANGED   Details  cholecalciferol (VITAMIN D) 1000 units tablet Take 1,000 Units by mouth daily.    Cyanocobalamin (VITAMIN B 12 PO) Take 1 tablet by mouth daily.    donepezil (ARICEPT) 10 MG tablet Take 10 mg by mouth at bedtime.     ferrous sulfate 325 (65 FE) MG tablet Take 325 mg by mouth daily with breakfast.    Multiple Vitamins-Minerals (MULTIVITAMIN WITH MINERALS) tablet Take 1 tablet by mouth daily.    vitamin A 10000 UNIT capsule Take 10,000 Units by mouth daily.       No Known Allergies Follow-up Information    Follow up with Meredith Pel, MD In 2 weeks.   Specialty:  Orthopedic Surgery   Contact information:   [place] [address] [phone]        The results of significant diagnostics from this hospitalization (including imaging, microbiology, ancillary and laboratory) are listed below for reference.    Significant Diagnostic Studies: Dg Chest 2 View  10/01/2015  CLINICAL DATA:  Fall yesterday walking the dog EXAM: CHEST  2 VIEW COMPARISON:  None. FINDINGS: Borderline cardiomegaly. Hyperinflation is noted. No infiltrate or pulmonary edema. Thoracic spine osteopenia. No gross fractures are noted. No pneumothorax. IMPRESSION: No active disease. Hyperinflation. Cardiomegaly. Thoracic spine osteopenia. No gross fractures. No pneumothorax. Electronically Signed   By: Lahoma Crocker M.D.   On: 10/01/2015 14:00   Dg Wrist 2 Views Left  10/02/2015  CLINICAL DATA:  ORIF left wrist fracture EXAM: LEFT WRIST - 2 VIEW COMPARISON:  None FLUOROSCOPY TIME:  30 seconds FINDINGS: Distal left radial metaphysis fracture transfixed with a volar side plate and multiple interlocking screws with the fracture in near anatomic alignment. No other fracture or dislocation.  IMPRESSION: Interval ORIF left distal radial fracture. Electronically Signed   By: Kathreen Devoid   On: 10/02/2015 21:32   Dg Wrist Complete Left  10/01/2015  CLINICAL DATA:  Status post fall a few days ago with a left wrist injury. Pain and swelling. Initial encounter. EXAM: LEFT WRIST - COMPLETE 3+ VIEW COMPARISON:  None. FINDINGS: The patient has a fracture of the distal radius with intra-articular involvement. There is impaction of the volar aspect of the radius above 0.6 cm. The fracture appears mildly comminuted. No other acute bony or joint abnormality is seen. Bones are osteopenic. Atherosclerosis is noted. Soft tissue swelling is present about the wrist. First CMC and scaphoid trapezium trapezoid joint osteoarthritis is noted. IMPRESSION: Impacted distal radius fracture involves articular surface. Osteopenia. Atherosclerosis. Electronically Signed   By: Inge Rise M.D.   On: 10/01/2015 12:20   Ct Head Wo Contrast  10/01/2015  CLINICAL DATA:  82 year old female with a history of fall EXAM: CT HEAD WITHOUT CONTRAST TECHNIQUE: Contiguous axial images were obtained from the base of the skull through the vertex without  intravenous contrast. COMPARISON:  None. FINDINGS: Unremarkable appearance of the calvarium without acute fracture or aggressive lesion. Unremarkable appearance of the scalp soft tissues. Unremarkable appearance of the bilateral orbits. Mastoid air cells are clear. No significant paranasal sinus disease No acute intracranial hemorrhage. No midline shift or mass effect. Gray-white differentiation relatively maintained. Confluent hypodensity in the periventricular white matter. Mild brain volume loss. Suggesting calcifications. IMPRESSION: No CT evidence of acute intracranial abnormality. Senescent brain volume loss and chronic white matter disease. Signed, Dulcy Fanny. Earleen Newport, DO [facility] Radiology Electronically Signed   By: Corrie Mckusick  D.O.   On: 10/01/2015 13:44   Pelvis Portable  10/02/2015  CLINICAL DATA:  Post left IM femoral nail. EXAM: PORTABLE PELVIS 1-2 VIEWS COMPARISON:  Intraoperative fluoroscopy 10/02/2015 FINDINGS: Postoperative changes with long intra medullary rod and compression screw fixation of the inter trochanteric fractures of the proximal left femur. The distal portion of the intra medullary rod is not included within the field of view. Visualized components appear well seated. No dislocation at the hip joint. Subcutaneous emphysema is likely due to recent surgery. Prominent vascular calcifications are present. IMPRESSION: Intra medullary rod and compression screw fixation of intertrochanteric fracture of the proximal left femur. Electronically Signed   By: Lucienne Capers M.D.   On: 10/02/2015 21:43   Dg C-arm 1-60 Min  10/02/2015  CLINICAL DATA:  ORIF left hip fracture EXAM: DG C-ARM 61-120 MIN; LEFT FEMUR 2 VIEWS COMPARISON:  None FLUOROSCOPY TIME:  30 seconds FINDINGS: Left intertrochanteric fracture transfixed with an intramedullary nail and 2 cannulated femoral neck screws. The fracture is in near anatomic alignment. IMPRESSION: ORIF left intertrochanteric fracture. Electronically Signed   By: Kathreen Devoid   On: 10/02/2015 20:36   Dg Hip Unilat With Pelvis 2-3 Views Left  10/01/2015  CLINICAL DATA:  82 year old female with a history of fall a few days prior. Ongoing hip pain EXAM: DG HIP (WITH OR WITHOUT PELVIS) 2-3V LEFT COMPARISON:  None. FINDINGS: Diffuse osteopenia. Acute fracture of the left femoral neck, with proximal migration of the femur fracture fragment. Bony pelvic ring appears intact with no pelvic fracture identified. Degenerative changes of the lower lumbar spine. Degenerative changes the bilateral hips. Extensive vascular calcifications. IMPRESSION: Acute left femoral neck fracture with angulation at the fracture site and proximal migration of the distal fracture fragment. Atherosclerosis  Signed, Dulcy Fanny. Earleen Newport, DO [facility] Radiology Electronically Signed   By: Corrie Mckusick D.O.   On: 10/01/2015 12:25   Dg Femur Min 2 Views Left  10/02/2015  CLINICAL DATA:  ORIF left hip fracture EXAM: DG C-ARM 61-120 MIN; LEFT FEMUR 2 VIEWS COMPARISON:  None FLUOROSCOPY TIME:  30 seconds FINDINGS: Left intertrochanteric fracture transfixed with an intramedullary nail and 2 cannulated femoral neck screws. The fracture is in near anatomic alignment. IMPRESSION: ORIF left intertrochanteric fracture. Electronically Signed   By: Kathreen Devoid   On: 10/02/2015 20:36    Microbiology: Recent Results (from the past 240 hour(s))  MRSA PCR Screening     Status: None   Collection Time: 10/01/15  4:47 PM  Result Value Ref Range Status   MRSA by PCR NEGATIVE NEGATIVE Final    Comment:        The GeneXpert MRSA Assay (FDA approved for NASAL specimens only), is one component of a comprehensive MRSA colonization surveillance program. It is not intended to diagnose MRSA infection nor to guide or monitor treatment for MRSA infections.  Urine culture     Status: Abnormal   Collection Time: 10/02/15  6:14 AM  Result Value Ref Range Status   Specimen Description URINE, RANDOM  Final   Special Requests NONE  Final   Culture <10,000 COLONIES/mL INSIGNIFICANT GROWTH (A)  Final   Report Status 10/03/2015 FINAL  Final     Labs: Basic Metabolic Panel:  Recent Labs Lab 10/02/15 0711 10/04/15 0414 10/05/15 0423 10/06/15 0423 10/07/15 0359  NA 139 139 142 141 137  K 4.1 4.1 3.7 3.4* 4.3  CL 103 103 103 106 107  CO2 27 25 28 27 26   GLUCOSE 107* 116* 109* 91 97  BUN 24* 20 24* 27* 20  CREATININE 0.60 0.38* 0.50 0.35* 0.35*  CALCIUM 8.3* 7.7* 8.0* 7.5* 7.4*   Liver Function Tests:  Recent Labs Lab 10/01/15 1515 10/05/15 0423  AST  --  21  ALT  --  16  ALKPHOS  --  116  BILITOT  --  1.6*  PROT  --  5.2*  ALBUMIN 3.5 2.3*   No results  for input(s): LIPASE, AMYLASE in the last 168 hours. No results for input(s): AMMONIA in the last 168 hours. CBC:  Recent Labs Lab 10/01/15 1244 10/01/15 1258 10/02/15 0711 10/04/15 0414 10/05/15 0423 10/06/15 0423  WBC 18.9*  --  19.1* 19.4* 18.2* 15.3*  HGB 10.1* 11.9* 9.2* 7.9* 10.8* 10.1*  HCT 33.7* 35.0* 30.3* 26.6* 34.4* 32.9*  MCV 88.5  --  87.1 89.3 86.9 87.7  PLT 368  --  398 333 427* 387   Cardiac Enzymes:   Signed:  Oswald Hillock MD.  [facility] 10/07/2015, 11:34 AM

## 2015-10-07 NOTE — Progress Notes (Signed)
Physical Therapy Treatment Patient Details Name: Kristen Clark MRN: [id number] DOB: 10-Sep-1929 Today's Date: 10/07/2015    History of Present Illness Pt s/p fall with resulting L hip and wrist fx. Underwent ORIF L hip and L wrist 7/12. medical history significant for dementia on Aricept, significant protein calorie malnutrition with continued weight loss, vitamin D deficiency and osteopenia .     PT Comments    Pt requires A for all aspects of mobility and is unable to maintain NWBing on both L UE and LE despite max cueing.  Pt with increased WOB during mobility, but denies SOB when asked and turned away RT when he arrived for breathing treatment.  RN made aware of breathing and pt declining RT.  Feel pt will need SNF level of care at D/C.  Will continue to follow while on acute.    Follow Up Recommendations  SNF     Equipment Recommendations  Wheelchair (measurements PT);Wheelchair cushion (measurements PT)    Recommendations for Other Services       Precautions / Restrictions Precautions Precautions: Fall Required Braces or Orthoses: Other Brace/Splint Other Brace/Splint: L wrist splint Restrictions Weight Bearing Restrictions: Yes LUE Weight Bearing: Non weight bearing LLE Weight Bearing: Non weight bearing    Mobility  Bed Mobility Overal bed mobility: Needs Assistance;+2 for physical assistance Bed Mobility: Supine to Sit;Sit to Supine     Supine to sit: Max assist;+2 for physical assistance;HOB elevated Sit to supine: Max assist;+2 for physical assistance   General bed mobility comments: pt does attempt to participate, but extensive A needed.    Transfers Overall transfer level: Needs assistance Equipment used: 2 person hand held assist Transfers: Sit to/from [employer] Sit to Stand: Max assist;+2 physical assistance Stand pivot transfers: Max assist;+2 physical assistance       General transfer comment: pt unable to maintain NWBing on L LE  despite cues.  pt needs increased A to near total A for transfer and maintaining NWBing.    Ambulation/Gait                 Stairs            Wheelchair Mobility    Modified Rankin (Stroke Patients Only)       Balance Overall balance assessment: Needs assistance Sitting-balance support: Bilateral upper extremity supported;Feet supported Sitting balance-Leahy Scale: Poor Sitting balance - Comments: pt uses L UE for support despite cueing for NWBing on L wrist.   Postural control: Posterior lean Standing balance support: During functional activity Standing balance-Leahy Scale: Zero                      Cognition Arousal/Alertness: Awake/alert Behavior During Therapy: Anxious Overall Cognitive Status: History of cognitive impairments - at baseline (Hx of Dementia.)                      Exercises      General Comments        Pertinent Vitals/Pain Pain Assessment: Faces Faces Pain Scale: Hurts little more Pain Location: pt indicates L LE hurts, but does not rate. Pain Descriptors / Indicators: Discomfort;Grimacing;Guarding Pain Intervention(s): Monitored during session;Premedicated before session;Repositioned    Home Living                      Prior Function            PT Goals (current goals can now be found in the care plan  section) Acute Rehab PT Goals Patient Stated Goal: Did not state. PT Goal Formulation: With patient Time For Goal Achievement: 10/18/15 Potential to Achieve Goals: Good Progress towards PT goals: Progressing toward goals    Frequency  Min 3X/week    PT Plan Frequency needs to be updated    Co-evaluation             End of Session Equipment Utilized During Treatment: Gait belt;Oxygen Activity Tolerance: Patient limited by fatigue;Patient limited by pain (Increased WOB, but pt denies.) Patient left: in bed;with call bell/phone within reach;with bed alarm set     Time: [id number] PT Time  Calculation (min) (ACUTE ONLY): 32 min  Charges:  $Therapeutic Activity: 23-37 mins                    G CodesSunny Schlein:      Kristen Clark F, [place]PT [phone] 10/07/2015, 12:08 PM

## 2015-10-07 NOTE — Clinical Social Work Note (Signed)
Patient to be d/c'ed today to [facility].  Patient and family agreeable to plans will transport via ems RN to call report to [phone].  Windell MouldingEric Shalae Belmonte, MSW, Theresia MajorsLCSWA [phone]

## 2015-10-08 ENCOUNTER — Encounter: Payer: Self-pay | Admitting: Adult Health

## 2015-10-08 ENCOUNTER — Non-Acute Institutional Stay (SKILLED_NURSING_FACILITY): Payer: Medicare Other | Admitting: Adult Health

## 2015-10-08 DIAGNOSIS — E43 Unspecified severe protein-calorie malnutrition: Secondary | ICD-10-CM | POA: Diagnosis not present

## 2015-10-08 DIAGNOSIS — D649 Anemia, unspecified: Secondary | ICD-10-CM | POA: Diagnosis not present

## 2015-10-08 DIAGNOSIS — F0150 Vascular dementia without behavioral disturbance: Secondary | ICD-10-CM | POA: Diagnosis not present

## 2015-10-08 DIAGNOSIS — S5292XS Unspecified fracture of left forearm, sequela: Secondary | ICD-10-CM | POA: Diagnosis not present

## 2015-10-08 DIAGNOSIS — S72002D Fracture of unspecified part of neck of left femur, subsequent encounter for closed fracture with routine healing: Secondary | ICD-10-CM

## 2015-10-08 NOTE — Progress Notes (Signed)
Patient ID: Kristen Clark, female   DOB: 11/13/1929, 82 y.o.   MRN: [id number]   Location:   [facility] Room Number: 118-A Place of Service:  SNF (31)   CODE STATUS: DNR  No Known Allergies  Chief Complaint  Patient presents with  . Hospitalization Follow-up    Hospital Follow up    HPI:  She has been hospitalized after a fall at home for a left femur and left radial fractures. She is here for sort term rehab. She does have pain present and has just received vicodin for her pain. She is to be on heparin for 30 days total. There are no nursing concerns at this time.    Past Medical History  Diagnosis Date  . Femoral fracture (HCC) 09/30/2015    Fracture of femoral neck, left after mechanical fall  . Left radial fracture 09/30/2015    after mechanical fall  . Insomnia   . Arthritis     "generalized" (10/01/2015)  . Anxiety   . Dementia     "aggressive" (10/01/2015)    Past Surgical History  Procedure Laterality Date  . Cataract extraction Bilateral early 2000s  . Femur im nail Left 10/02/2015    Procedure: INTRAMEDULLARY (IM) NAIL FEMORAL;  Surgeon: Cammy CopaScott Gregory Dean, MD;  Location: [facility] OR;  Service: Orthopedics;  Laterality: Left;  . Orif wrist fracture Left 10/02/2015    Procedure: OPEN REDUCTION INTERNAL FIXATION (ORIF) WRIST FRACTURE;  Surgeon: Cammy CopaScott Gregory Dean, MD;  Location: [facility] OR;  Service: Orthopedics;  Laterality: Left;    Social History   Social History  . Marital Status: Widowed    Spouse Name: N/A  . Number of Children: N/A  . Years of Education: N/A   Occupational History  . Not on file.   Social History Main Topics  . Smoking status: Never Smoker   . Smokeless tobacco: Never Used  . Alcohol Use: No  . Drug Use: No  . Sexual Activity: No   Other Topics Concern  . Not on file   Social History Narrative   No family history on file.    VITAL SIGNS BP 118/68 mmHg  Pulse 88  Temp(Src) 96.8 F (36 C) (Oral)  Resp 22  Ht 5' (1.524  m)  Wt 62 lb (28.123 kg)  BMI 12.11 kg/m2  SpO2 95%  Patient's Medications  New Prescriptions   No medications on file  Previous Medications   CHOLECALCIFEROL (VITAMIN D) 1000 UNITS TABLET    Take 1,000 Units by mouth daily.   DOCUSATE SODIUM (COLACE) 100 MG CAPSULE    Take 1 capsule (100 mg total) by mouth 2 (two) times daily.   DONEPEZIL (ARICEPT) 10 MG TABLET    Take 10 mg by mouth at bedtime.    FERROUS SULFATE 325 (65 FE) MG TABLET    Take 325 mg by mouth daily with breakfast.   HEPARIN 5000 UNIT/ML INJECTION    Inject 1 mL (5,000 Units total) into the skin every 8 (eight) hours.   HYDROCODONE-ACETAMINOPHEN (NORCO/VICODIN) 5-325 MG TABLET    Take 1-2 tablets by mouth every 6 (six) hours as needed for moderate pain.   MULTIPLE VITAMINS-MINERALS (MULTIVITAMIN WITH MINERALS) TABLET    Take 1 tablet by mouth daily.   ONDANSETRON (ZOFRAN) 4 MG TABLET    Take 1 tablet (4 mg total) by mouth every 6 (six) hours as needed for nausea.   THIAMINE 100 MG TABLET    Take 100 mg by mouth daily.  VITAMIN A 40981 UNIT CAPSULE    Take 10,000 Units by mouth daily.  Modified Medications   No medications on file  Discontinued Medications   CYANOCOBALAMIN (VITAMIN B 12 PO)    Take 1 tablet by mouth daily. Reported on 10/08/2015   FEEDING SUPPLEMENT, ENSURE ENLIVE, (ENSURE ENLIVE) LIQD    Take 237 mLs by mouth 2 (two) times daily between meals.     SIGNIFICANT DIAGNOSTIC EXAMS  10-01-15: left wrist x-ray; Impacted distal radius fracture involves articular surface. Osteopenia. Atherosclerosis.  10-01-15; chest x-ray: Borderline cardiomegaly. Hyperinflation is noted. No infiltrate or pulmonary edema. Thoracic spine osteopenia. No gross fractures are noted. No pneumothorax.  10-01-15: ct of head and cervical spine: No CT evidence of acute intracranial abnormality. Senescent brain volume loss and chronic white matter disease.  10-01-15; left hip x-ray; Acute left femoral neck fracture with angulation at the  fracture site and proximal migration of the distal fracture fragment. Atherosclerosis  10-01-15: 2-d echo: - Left ventricle: The cavity size was normal. Systolic function was normal. The estimated ejection fraction was in the range of 60% to 65%. Wall motion was normal; there were no regional wall motion abnormalities. Doppler parameters are consistent with abnormal left ventricular relaxation (grade 1 diastolic dysfunction). Doppler parameters are consistent with elevated ventricular end-diastolic filling pressure. - Aortic valve: Trileaflet; normal thickness leaflets. There was no regurgitation. - Aortic root: The aortic root was normal in size. - Mitral valve: Thickening. Prolapse. There was moderate regurgitation. - Right ventricle: The cavity size was normal. Wall thickness was normal. Systolic function was normal. - Right atrium: The atrium was normal in size. - Tricuspid valve: There was trivial regurgitation. - Pulmonary arteries: Systolic pressure was within the normal range. - Inferior vena cava: The vessel was normal in size.    LABS REVIEWED:   10-01-15; wbc 18.9; hgb 10.1; hct 33.7; mcv 88.5 plt 368; glucose 129; bun 19; creat 0.52; k+ 4.2; na++ 138; vit D 75.4; albumin 3.5 10-02-15: vit B12: 1316; folate 37.9; iron 8; tibc 316; feritin 67; urine culture: <10,000 10-05-15: wbc 18.2; hgb 10.8; hct 34.4; mcv 86.9; plt 427; glucose 109; bun 24; creat 0.50; k+ 3.7; na++ 142; liver normal albumin 2.3 10-07-15: glucose 97; bun 20; creat 0.35; k+ 4.3; na++ 137     Review of Systems  Constitutional: Negative for malaise/fatigue.  Respiratory: Negative for cough and shortness of breath.   Cardiovascular: Negative for chest pain, palpitations and leg swelling.  Gastrointestinal: Negative for heartburn, abdominal pain and constipation.  Musculoskeletal: Positive for joint pain. Negative for myalgias and back pain.       Has left hip left wrist pain   Skin: Negative.   Neurological:  Negative for dizziness.  Psychiatric/Behavioral: The patient is not nervous/anxious.       Physical Exam  Constitutional: No distress.  Very frail   Eyes: Conjunctivae are normal.  Neck: Neck supple. No JVD present. No thyromegaly present.  Cardiovascular: Normal rate, regular rhythm and intact distal pulses.   Respiratory: Effort normal and breath sounds normal. No respiratory distress. She has no wheezes.  GI: Soft. Bowel sounds are normal. She exhibits no distension. There is no tenderness.  Musculoskeletal: She exhibits no edema.  Able to move all extremities  Is status post left hip fracture  Left wrist in ace cast is able to move fingers has rapid capillary refill   Lymphadenopathy:    She has no cervical adenopathy.  Neurological: She is alert.  Skin: Skin is  warm and dry. She is not diaphoretic.  Psychiatric: She has a normal mood and affect.     ASSESSMENT/ PLAN:  1. Dementia: her current weight is 62 pounds; will lower her aricept to 5 mg nightly due to her low body weight  and will monitor her status.   2. Constipation: will continue colace twice daily   3. Anemia: hgb is 10.8; her iron level is 8; will change her iron to twice daily and will monitor  4. Left femur fracture and left radial fracture is status post orif; will follow up with orthopedics as indicated; will continue therapy as directed; will continue heparin 5,0000 units three times daily for a total of 30 days; will continue vicodin 5/325 mg  1 or 2 tabs every 6 hours as needed   5. Depression with anxiety: will restart her zoloft at 25 mg daily   6. Severe protein calorie malnutrition: will continue supplements per facility protocol    Time spent with patient  50   minutes >50% time spent counseling; reviewing medical record; tests; labs; and developing future plan of care     Synthia Innocent NP [facility]  Contact [phone] Monday through Friday 8am- 5pm  After hours call  [phone]

## 2015-10-10 ENCOUNTER — Non-Acute Institutional Stay (SKILLED_NURSING_FACILITY): Payer: Medicare Other | Admitting: Internal Medicine

## 2015-10-10 DIAGNOSIS — K5901 Slow transit constipation: Secondary | ICD-10-CM

## 2015-10-10 DIAGNOSIS — S5292XD Unspecified fracture of left forearm, subsequent encounter for closed fracture with routine healing: Secondary | ICD-10-CM | POA: Diagnosis not present

## 2015-10-10 DIAGNOSIS — F329 Major depressive disorder, single episode, unspecified: Secondary | ICD-10-CM

## 2015-10-10 DIAGNOSIS — E559 Vitamin D deficiency, unspecified: Secondary | ICD-10-CM

## 2015-10-10 DIAGNOSIS — S72002D Fracture of unspecified part of neck of left femur, subsequent encounter for closed fracture with routine healing: Secondary | ICD-10-CM | POA: Diagnosis not present

## 2015-10-10 DIAGNOSIS — F418 Other specified anxiety disorders: Secondary | ICD-10-CM

## 2015-10-10 DIAGNOSIS — F0150 Vascular dementia without behavioral disturbance: Secondary | ICD-10-CM

## 2015-10-10 DIAGNOSIS — D62 Acute posthemorrhagic anemia: Secondary | ICD-10-CM | POA: Diagnosis not present

## 2015-10-10 DIAGNOSIS — D649 Anemia, unspecified: Secondary | ICD-10-CM

## 2015-10-10 DIAGNOSIS — F419 Anxiety disorder, unspecified: Secondary | ICD-10-CM

## 2015-10-10 NOTE — Progress Notes (Signed)
MRN: [id number] Name: Kristen Clark  Sex: female Age: 82 y.o. DOB: 04-04-1929  PSC #:  Facility/Room: [facility] / 118 A Level Of Care: SNF Provider: Randon Goldsmith. Lyn Hollingshead, MD Emergency Contacts: Extended Emergency Contact Information Primary Emergency Contact: Bertagnolli,Karen Address: [address]          [place], [place] [address] [place] Phone: [phone] Relation: Daughter  Code Status: DNR  Allergies: Review of patient's allergies indicates no known allergies.  Chief Complaint  Patient presents with  . New Admit To SNF    Admit to Facility    HPI: Patient is 82 y.o. female with  dementia on Aricept, significant protein calorie malnutrition with continued weight loss, vitamin D deficiency and osteopenia who presents to the ER after experiencing mechanical fall yesterday. Patient had been visiting a family friend along with her daughter when the family friend's dog rushed the patient knocking her legs out from under her and she fell on her left side. She was having some pain but not significant enough to warrant the [facility] medical attention. Unfortunately after waking up sweating the pain was very severe and patient decided to seek medical treatment.Pt was admitted to [facility] where she underwent ORIF for her L wrist fx and IM nailing for her L hip fracture. Course was complicated by post op blood lodd requiring tx og=f 1 u PRBC. PT is admitted to SNF for OT/PT. While at SNF she will be followed for Vit D def, tx with replacement, dementia, tx with aricept and depression and anxiety, tx with zoloft.  Past Medical History  Diagnosis Date  . Femoral fracture (HCC) 09/30/2015    Fracture of femoral neck, left after mechanical fall  . Left radial fracture 09/30/2015    after mechanical fall  . Insomnia   . Arthritis     "generalized" (10/01/2015)  . Anxiety   . Dementia     "aggressive" (10/01/2015)    Past Surgical History  Procedure Laterality Date  . Cataract  extraction Bilateral early 2000s  . Femur im nail Left 10/02/2015    Procedure: INTRAMEDULLARY (IM) NAIL FEMORAL;  Surgeon: Cammy Copa, MD;  Location: [facility] OR;  Service: Orthopedics;  Laterality: Left;  . Orif wrist fracture Left 10/02/2015    Procedure: OPEN REDUCTION INTERNAL FIXATION (ORIF) WRIST FRACTURE;  Surgeon: Cammy Copa, MD;  Location: [facility] OR;  Service: Orthopedics;  Laterality: Left;      Medication List       This list is accurate as of: 10/10/15 10:02 AM.  Always use your most recent med list.               cholecalciferol 1000 units tablet  Commonly known as:  VITAMIN D  Take 1,000 Units by mouth daily.     CYANOCOBALAMIN PO  Take 1 tablet by mouth daily.     docusate sodium 100 MG capsule  Commonly known as:  COLACE  Take 1 capsule (100 mg total) by mouth 2 (two) times daily.     donepezil 10 MG tablet  Commonly known as:  ARICEPT  Take 10 mg by mouth at bedtime.     ferrous sulfate 325 (65 FE) MG tablet  Take 325 mg by mouth daily with breakfast.     heparin 5000 UNIT/ML injection  Inject 1 mL (5,000 Units total) into the skin every 8 (eight) hours.     HYDROcodone-acetaminophen 5-325 MG tablet  Commonly known as:  NORCO/VICODIN  Take 1-2 tablets by mouth  every 6 (six) hours as needed for moderate pain.     multivitamin with minerals tablet  Take 1 tablet by mouth daily.     ondansetron 4 MG tablet  Commonly known as:  ZOFRAN  Take 1 tablet (4 mg total) by mouth every 6 (six) hours as needed for nausea.     vitamin A 16109 UNIT capsule  Take 10,000 Units by mouth daily.        Meds ordered this encounter  Medications  . CYANOCOBALAMIN PO    Sig: Take 1 tablet by mouth daily.     There is no immunization history on file for this patient.  Social History  Substance Use Topics  . Smoking status: Never Smoker   . Smokeless tobacco: Never Used  . Alcohol Use: No    Family history is UTO 2/2 pt dementia  No family history on  file.    Review of Systems  DATA OBTAINED: from patient GENERAL:  no fevers, fatigue, appetite changes SKIN: No itching, rash or wounds EYES: No eye pain, redness, discharge EARS: No earache, tinnitus, change in hearing NOSE: No congestion, drainage or bleeding  MOUTH/THROAT: No mouth or tooth pain, No sore throat RESPIRATORY: No cough, wheezing, SOB CARDIAC: No chest pain, palpitations, lower extremity edema  GI: No abdominal pain, No N/V/D or constipation, No heartburn or reflux  GU: No dysuria, frequency or urgency, or incontinence  MUSCULOSKELETAL: No unrelieved bone/joint pain NEUROLOGIC: No headache, dizziness or focal weakness PSYCHIATRIC: No c/o anxiety or sadness   Filed Vitals:   10/10/15 0939  BP: 118/68  Pulse: 88  Temp: 96.8 F (36 C)  Resp: 22    SpO2 Readings from Last 1 Encounters:  10/10/15 95%        Physical Exam  GENERAL APPEARANCE: Alert, conversant,  No acute distress.  SKIN: No diaphoresis rash HEAD: Normocephalic, atraumatic  EYES: Conjunctiva/lids clear. Pupils round, reactive. EOMs intact.  EARS: External exam WNL, canals clear. Hearing grossly normal.  NOSE: No deformity or discharge.  MOUTH/THROAT: Lips w/o lesions  RESPIRATORY: Breathing is even, unlabored. Lung sounds are clear   CARDIOVASCULAR: Heart RRR no murmurs, rubs or gallops. No peripheral edema.   GASTROINTESTINAL: Abdomen is soft, non-tender, not distended w/ normal bowel sounds. GENITOURINARY: Bladder non tender, not distended  MUSCULOSKELETAL: No abnormal joints or musculature; frail; cast on L wrist NEUROLOGIC:  Cranial nerves 2-12 grossly intact. Moves all extremities  PSYCHIATRIC: Mood and affect appropriate to situation with dementia, no behavioral issues  Patient Active Problem List   Diagnosis Date Noted  . Vascular dementia without behavioral disturbance 10/08/2015  . Anemia due to other cause   . Pressure ulcer 10/02/2015  . Hip fracture (HCC) 10/02/2015  .  Palliative care encounter   . Goals of care, counseling/discussion   . Left radial fracture 10/01/2015  . Acute hyperglycemia 10/01/2015  . Osteopenia 10/01/2015  . Fracture of femoral neck, left (HCC) 10/01/2015  . Femoral neck fracture, left, closed, initial encounter 10/01/2015  . Severe protein-calorie malnutrition Lily Kocher: less than 60% of standard weight) (HCC) 10/01/2015  . Abnormal ECG 10/01/2015  . Vitamin D deficiency 10/01/2015  . Normocytic anemia 10/01/2015       Component Value Date/Time   WBC 15.3* 10/06/2015 0423   WBC 15.3 10/06/2015   RBC 3.75* 10/06/2015 0423   RBC 3.48* 10/02/2015 0711   HGB 10.1* 10/06/2015 0423   HCT 32.9* 10/06/2015 0423   PLT 387 10/06/2015 0423   MCV 87.7 10/06/2015 0423  Component Value Date/Time   NA 137 10/07/2015 0359   NA 137 10/07/2015   K 4.3 10/07/2015 0359   CL 107 10/07/2015 0359   CO2 26 10/07/2015 0359   GLUCOSE 97 10/07/2015 0359   BUN 20 10/07/2015 0359   BUN 20 10/07/2015   CREATININE 0.35* 10/07/2015 0359   CREATININE 0.4* 10/07/2015   CALCIUM 7.4* 10/07/2015 0359   PROT 5.2* 10/05/2015 0423   ALBUMIN 2.3* 10/05/2015 0423   AST 21 10/05/2015 0423   ALT 16 10/05/2015 0423   ALKPHOS 116 10/05/2015 0423   BILITOT 1.6* 10/05/2015 0423   GFRNONAA >60 10/07/2015 0359   GFRAA >60 10/07/2015 0359    No results found for: HGBA1C  No results found for: CHOL, HDL, LDLCALC, LDLDIRECT, TRIG, CHOLHDL   Dg Chest 2 View  10/01/2015  CLINICAL DATA:  Fall yesterday walking the dog EXAM: CHEST  2 VIEW COMPARISON:  None. FINDINGS: Borderline cardiomegaly. Hyperinflation is noted. No infiltrate or pulmonary edema. Thoracic spine osteopenia. No gross fractures are noted. No pneumothorax. IMPRESSION: No active disease. Hyperinflation. Cardiomegaly. Thoracic spine osteopenia. No gross fractures. No pneumothorax. Electronically Signed   By: Natasha Mead M.D.   On: 10/01/2015 14:00   Dg Wrist 2 Views Left  10/02/2015  CLINICAL  DATA:  ORIF left wrist fracture EXAM: LEFT WRIST - 2 VIEW COMPARISON:  None FLUOROSCOPY TIME:  30 seconds FINDINGS: Distal left radial metaphysis fracture transfixed with a volar side plate and multiple interlocking screws with the fracture in near anatomic alignment. No other fracture or dislocation. IMPRESSION: Interval ORIF left distal radial fracture. Electronically Signed   By: Elige Ko   On: 10/02/2015 21:32   Dg Wrist Complete Left  10/01/2015  CLINICAL DATA:  Status post fall a few days ago with a left wrist injury. Pain and swelling. Initial encounter. EXAM: LEFT WRIST - COMPLETE 3+ VIEW COMPARISON:  None. FINDINGS: The patient has a fracture of the distal radius with intra-articular involvement. There is impaction of the volar aspect of the radius above 0.6 cm. The fracture appears mildly comminuted. No other acute bony or joint abnormality is seen. Bones are osteopenic. Atherosclerosis is noted. Soft tissue swelling is present about the wrist. First CMC and scaphoid trapezium trapezoid joint osteoarthritis is noted. IMPRESSION: Impacted distal radius fracture involves articular surface. Osteopenia. Atherosclerosis. Electronically Signed   By: Drusilla Kanner M.D.   On: 10/01/2015 12:20   Ct Head Wo Contrast  10/01/2015  CLINICAL DATA:  82 year old female with a history of fall EXAM: CT HEAD WITHOUT CONTRAST TECHNIQUE: Contiguous axial images were obtained from the base of the skull through the vertex without intravenous contrast. COMPARISON:  None. FINDINGS: Unremarkable appearance of the calvarium without acute fracture or aggressive lesion. Unremarkable appearance of the scalp soft tissues. Unremarkable appearance of the bilateral orbits. Mastoid air cells are clear. No significant paranasal sinus disease No acute intracranial hemorrhage. No midline shift or mass effect. Gray-white differentiation relatively maintained. Confluent hypodensity in the periventricular white matter. Mild brain  volume loss. Suggesting calcifications. IMPRESSION: No CT evidence of acute intracranial abnormality. Senescent brain volume loss and chronic white matter disease. Signed, Yvone Neu. Loreta Ave, DO [facility] Electronically Signed   By: Gilmer Mor D.O.   On: 10/01/2015 13:44   Pelvis Portable  10/02/2015  CLINICAL DATA:  Post left IM femoral nail. EXAM: PORTABLE PELVIS 1-2 VIEWS COMPARISON:  Intraoperative fluoroscopy 10/02/2015 FINDINGS: Postoperative changes with long intra medullary rod and compression  screw fixation of the inter trochanteric fractures of the proximal left femur. The distal portion of the intra medullary rod is not included within the field of view. Visualized components appear well seated. No dislocation at the hip joint. Subcutaneous emphysema is likely due to recent surgery. Prominent vascular calcifications are present. IMPRESSION: Intra medullary rod and compression screw fixation of intertrochanteric fracture of the proximal left femur. Electronically Signed   By: Burman NievesWilliam  Stevens M.D.   On: 10/02/2015 21:43   Dg C-arm 1-60 Min  10/02/2015  CLINICAL DATA:  ORIF left hip fracture EXAM: DG C-ARM 61-120 MIN; LEFT FEMUR 2 VIEWS COMPARISON:  None FLUOROSCOPY TIME:  30 seconds FINDINGS: Left intertrochanteric fracture transfixed with an intramedullary nail and 2 cannulated femoral neck screws. The fracture is in near anatomic alignment. IMPRESSION: ORIF left intertrochanteric fracture. Electronically Signed   By: Elige KoHetal  Patel   On: 10/02/2015 20:36   Dg Hip Unilat With Pelvis 2-3 Views Left  10/01/2015  CLINICAL DATA:  82 year old female with a history of fall a few days prior. Ongoing hip pain EXAM: DG HIP (WITH OR WITHOUT PELVIS) 2-3V LEFT COMPARISON:  None. FINDINGS: Diffuse osteopenia. Acute fracture of the left femoral neck, with proximal migration of the femur fracture fragment. Bony pelvic ring appears intact with no pelvic  fracture identified. Degenerative changes of the lower lumbar spine. Degenerative changes the bilateral hips. Extensive vascular calcifications. IMPRESSION: Acute left femoral neck fracture with angulation at the fracture site and proximal migration of the distal fracture fragment. Atherosclerosis Signed, Yvone NeuJaime S. Loreta AveWagner, DO [facility] Electronically Signed   By: Gilmer MorJaime  Wagner D.O.   On: 10/01/2015 12:25   Dg Femur Min 2 Views Left  10/02/2015  CLINICAL DATA:  ORIF left hip fracture EXAM: DG C-ARM 61-120 MIN; LEFT FEMUR 2 VIEWS COMPARISON:  None FLUOROSCOPY TIME:  30 seconds FINDINGS: Left intertrochanteric fracture transfixed with an intramedullary nail and 2 cannulated femoral neck screws. The fracture is in near anatomic alignment. IMPRESSION: ORIF left intertrochanteric fracture. Electronically Signed   By: Elige KoHetal  Patel   On: 10/02/2015 20:36    Not all labs, radiology exams or other studies done during hospitalization come through on my EPIC note; however they are reviewed by me.    Assessment and Plan  LEFT FEMUR FRACTURE AND L RADIUS FRACTURE -  is status post orifof wrist and IM nailing of L hip; will follow up with orthopedics as indicated; will continue therapy as directed;  SNF- will continue heparin 5,0000 units three times daily for a total of 30 days; will continue vicodin 5/325 mg  1 or 2 tabs every 6 hours as needed   DEMENTIA SNF-  her current weight is 62 pounds; will lower her aricept to 5 mg nightly due to her low body weight  and will monitor her status.   CONSTIPATION:  SNF- will continue colace twice daily   CHRONIC ANEMIA:  SNF- d/c hgb is 10.8; her iron level is 8;B12 is nl ;will change her iron to twice daily and will monitor  ACUTE POST OP ANEMIA SNF - pt was tx 1 u PRBC, d/c Hb 10.8; will f/u CBC  DEPRESSION AND ANXIETY SNF: will restart her zoloft at 25 mg daily   SEVERE PROTEIN CALORIE  MALNUTRTION SNF-  will continue supplements per facility protocol   VIT D DEF SNF- cont supplement 1000u daily; will follow level    Time spent > 45 min;> 50% of time with patient  was spent reviewing records, labs, tests and studies, counseling and developing plan of care  Noah Delaine. Sheppard Coil, MD

## 2015-10-11 ENCOUNTER — Encounter: Payer: Self-pay | Admitting: Adult Health

## 2015-10-11 ENCOUNTER — Non-Acute Institutional Stay (SKILLED_NURSING_FACILITY): Payer: Medicare Other | Admitting: Adult Health

## 2015-10-11 DIAGNOSIS — M2550 Pain in unspecified joint: Secondary | ICD-10-CM | POA: Diagnosis not present

## 2015-10-11 DIAGNOSIS — E43 Unspecified severe protein-calorie malnutrition: Secondary | ICD-10-CM

## 2015-10-11 DIAGNOSIS — R05 Cough: Secondary | ICD-10-CM | POA: Diagnosis not present

## 2015-10-11 DIAGNOSIS — R110 Nausea: Secondary | ICD-10-CM | POA: Diagnosis not present

## 2015-10-11 DIAGNOSIS — M6281 Muscle weakness (generalized): Secondary | ICD-10-CM | POA: Diagnosis not present

## 2015-10-11 DIAGNOSIS — S5292XD Unspecified fracture of left forearm, subsequent encounter for closed fracture with routine healing: Secondary | ICD-10-CM

## 2015-10-11 DIAGNOSIS — F0390 Unspecified dementia without behavioral disturbance: Secondary | ICD-10-CM | POA: Diagnosis not present

## 2015-10-11 DIAGNOSIS — E559 Vitamin D deficiency, unspecified: Secondary | ICD-10-CM | POA: Diagnosis not present

## 2015-10-11 DIAGNOSIS — S72009A Fracture of unspecified part of neck of unspecified femur, initial encounter for closed fracture: Secondary | ICD-10-CM | POA: Diagnosis not present

## 2015-10-11 DIAGNOSIS — G301 Alzheimer's disease with late onset: Secondary | ICD-10-CM | POA: Diagnosis not present

## 2015-10-11 DIAGNOSIS — D62 Acute posthemorrhagic anemia: Secondary | ICD-10-CM | POA: Diagnosis not present

## 2015-10-11 DIAGNOSIS — S52512A Displaced fracture of left radial styloid process, initial encounter for closed fracture: Secondary | ICD-10-CM | POA: Diagnosis not present

## 2015-10-11 DIAGNOSIS — S52512D Displaced fracture of left radial styloid process, subsequent encounter for closed fracture with routine healing: Secondary | ICD-10-CM | POA: Diagnosis not present

## 2015-10-11 DIAGNOSIS — L89159 Pressure ulcer of sacral region, unspecified stage: Secondary | ICD-10-CM | POA: Diagnosis not present

## 2015-10-11 DIAGNOSIS — K5900 Constipation, unspecified: Secondary | ICD-10-CM | POA: Diagnosis not present

## 2015-10-11 DIAGNOSIS — E440 Moderate protein-calorie malnutrition: Secondary | ICD-10-CM | POA: Diagnosis not present

## 2015-10-11 DIAGNOSIS — R2681 Unsteadiness on feet: Secondary | ICD-10-CM | POA: Diagnosis not present

## 2015-10-11 DIAGNOSIS — S72002D Fracture of unspecified part of neck of left femur, subsequent encounter for closed fracture with routine healing: Secondary | ICD-10-CM

## 2015-10-11 DIAGNOSIS — S72002A Fracture of unspecified part of neck of left femur, initial encounter for closed fracture: Secondary | ICD-10-CM | POA: Diagnosis not present

## 2015-10-11 DIAGNOSIS — D649 Anemia, unspecified: Secondary | ICD-10-CM | POA: Diagnosis not present

## 2015-10-11 DIAGNOSIS — E46 Unspecified protein-calorie malnutrition: Secondary | ICD-10-CM | POA: Diagnosis not present

## 2015-10-11 DIAGNOSIS — S5290XA Unspecified fracture of unspecified forearm, initial encounter for closed fracture: Secondary | ICD-10-CM | POA: Diagnosis not present

## 2015-10-11 DIAGNOSIS — R1311 Dysphagia, oral phase: Secondary | ICD-10-CM | POA: Diagnosis not present

## 2015-10-11 DIAGNOSIS — M8580 Other specified disorders of bone density and structure, unspecified site: Secondary | ICD-10-CM | POA: Diagnosis not present

## 2015-10-11 DIAGNOSIS — S72142D Displaced intertrochanteric fracture of left femur, subsequent encounter for closed fracture with routine healing: Secondary | ICD-10-CM | POA: Diagnosis not present

## 2015-10-11 DIAGNOSIS — G308 Other Alzheimer's disease: Secondary | ICD-10-CM | POA: Diagnosis not present

## 2015-10-11 DIAGNOSIS — S52532D Colles' fracture of left radius, subsequent encounter for closed fracture with routine healing: Secondary | ICD-10-CM | POA: Diagnosis not present

## 2015-10-11 DIAGNOSIS — M84758D Complete oblique atypical femoral fracture, left leg, subsequent encounter for fracture with routine healing: Secondary | ICD-10-CM | POA: Diagnosis not present

## 2015-10-11 DIAGNOSIS — Z9181 History of falling: Secondary | ICD-10-CM | POA: Diagnosis not present

## 2015-10-11 DIAGNOSIS — G309 Alzheimer's disease, unspecified: Secondary | ICD-10-CM | POA: Diagnosis not present

## 2015-10-11 DIAGNOSIS — R2689 Other abnormalities of gait and mobility: Secondary | ICD-10-CM | POA: Diagnosis not present

## 2015-10-11 DIAGNOSIS — S728X2D Other fracture of left femur, subsequent encounter for closed fracture with routine healing: Secondary | ICD-10-CM | POA: Diagnosis not present

## 2015-10-11 DIAGNOSIS — L89152 Pressure ulcer of sacral region, stage 2: Secondary | ICD-10-CM | POA: Diagnosis not present

## 2015-10-11 DIAGNOSIS — L89620 Pressure ulcer of left heel, unstageable: Secondary | ICD-10-CM | POA: Diagnosis not present

## 2015-10-11 NOTE — Progress Notes (Signed)
Patient ID: Kristen Clark, female   DOB: [birth year], 82 y.o.   MRN: [id number]   Location:   [facility] Room Number: 118-A Place of Service:  SNF (31)    CODE STATUS: DNR  No Known Allergies  Chief Complaint  Patient presents with  . Discharge Note    Discharge from facility    HPI:  She is being discharged to another snf. She will not require any home health or dme. She will need her narcotic prescription to be written. She will follow up with the medical provider at the receiving facility.    Past Medical History  Diagnosis Date  . Femoral fracture (HCC) 09/30/2015    Fracture of femoral neck, left after mechanical fall  . Left radial fracture 09/30/2015    after mechanical fall  . Insomnia   . Arthritis     "generalized" (10/01/2015)  . Anxiety   . Dementia     "aggressive" (10/01/2015)    Past Surgical History  Procedure Laterality Date  . Cataract extraction Bilateral early 2000s  . Femur im nail Left 10/02/2015    Procedure: INTRAMEDULLARY (IM) NAIL FEMORAL;  Surgeon: Cammy Copa, MD;  Location: [facility] OR;  Service: Orthopedics;  Laterality: Left;  . Orif wrist fracture Left 10/02/2015    Procedure: OPEN REDUCTION INTERNAL FIXATION (ORIF) WRIST FRACTURE;  Surgeon: Cammy Copa, MD;  Location: [facility] OR;  Service: Orthopedics;  Laterality: Left;    Social History   Social History  . Marital Status: Widowed    Spouse Name: N/A  . Number of Children: N/A  . Years of Education: N/A   Occupational History  . Not on file.   Social History Main Topics  . Smoking status: Never Smoker   . Smokeless tobacco: Never Used  . Alcohol Use: No  . Drug Use: No  . Sexual Activity: No   Other Topics Concern  . Not on file   Social History Narrative   History reviewed. No pertinent family history.  VITAL SIGNS BP 118/68 mmHg  Pulse 88  Temp(Src) 96.8 F (36 C) (Oral)  Resp 22  Ht 5' (1.524 m)  Wt 62 lb (28.123 kg)  BMI 12.11 kg/m2  SpO2  95%  Patient's Medications  New Prescriptions   No medications on file  Previous Medications   CHOLECALCIFEROL (VITAMIN D) 1000 UNITS TABLET    Take 1,000 Units by mouth daily.   DOCUSATE SODIUM (COLACE) 100 MG CAPSULE    Take 1 capsule (100 mg total) by mouth 2 (two) times daily.   DONEPEZIL (ARICEPT) 10 MG TABLET    Take 10 mg by mouth at bedtime.    FERROUS SULFATE 325 (65 FE) MG TABLET    Take 325 mg by mouth daily with breakfast.   GUAIFENESIN (MUCINEX) 600 MG 12 HR TABLET    Take 600 mg by mouth 2 (two) times daily.   HEPARIN 5000 UNIT/ML INJECTION    Inject 1 mL (5,000 Units total) into the skin every 8 (eight) hours.   HYDROCODONE-ACETAMINOPHEN (NORCO/VICODIN) 5-325 MG TABLET    Take 1-2 tablets by mouth every 6 (six) hours as needed for moderate pain.   MULTIPLE VITAMINS-MINERALS (MULTIVITAMIN WITH MINERALS) TABLET    Take 1 tablet by mouth daily.   ONDANSETRON (ZOFRAN) 4 MG TABLET    Take 1 tablet (4 mg total) by mouth every 6 (six) hours as needed for nausea.   SERTRALINE (ZOLOFT) 25 MG TABLET    Take 25 mg by  mouth daily.   THIAMINE 100 MG TABLET    Take 100 mg by mouth daily.   VITAMIN A 40981 UNIT CAPSULE    Take 10,000 Units by mouth daily.  Modified Medications   No medications on file  Discontinued Medications   CYANOCOBALAMIN PO    Take 1 tablet by mouth daily. Reported on 10/11/2015     SIGNIFICANT DIAGNOSTIC EXAMS  10-01-15: left wrist x-ray; Impacted distal radius fracture involves articular surface. Osteopenia. Atherosclerosis.  10-01-15; chest x-ray: Borderline cardiomegaly. Hyperinflation is noted. No infiltrate or pulmonary edema. Thoracic spine osteopenia. No gross fractures are noted. No pneumothorax.  10-01-15: ct of head and cervical spine: No CT evidence of acute intracranial abnormality. Senescent brain volume loss and chronic white matter disease.  10-01-15; left hip x-ray; Acute left femoral neck fracture with angulation at the fracture site and proximal  migration of the distal fracture fragment. Atherosclerosis  10-01-15: 2-d echo: - Left ventricle: The cavity size was normal. Systolic function was normal. The estimated ejection fraction was in the range of 60% to 65%. Wall motion was normal; there were no regional wall motion abnormalities. Doppler parameters are consistent with abnormal left ventricular relaxation (grade 1 diastolic dysfunction). Doppler parameters are consistent with elevated ventricular end-diastolic filling pressure. - Aortic valve: Trileaflet; normal thickness leaflets. There was no regurgitation. - Aortic root: The aortic root was normal in size. - Mitral valve: Thickening. Prolapse. There was moderate regurgitation. - Right ventricle: The cavity size was normal. Wall thickness was normal. Systolic function was normal. - Right atrium: The atrium was normal in size. - Tricuspid valve: There was trivial regurgitation. - Pulmonary arteries: Systolic pressure was within the normal range. - Inferior vena cava: The vessel was normal in size.    LABS REVIEWED:   10-01-15; wbc 18.9; hgb 10.1; hct 33.7; mcv 88.5 plt 368; glucose 129; bun 19; creat 0.52; k+ 4.2; na++ 138; vit D 75.4; albumin 3.5 10-02-15: vit B12: 1316; folate 37.9; iron 8; tibc 316; feritin 67; urine culture: <10,000 10-05-15: wbc 18.2; hgb 10.8; hct 34.4; mcv 86.9; plt 427; glucose 109; bun 24; creat 0.50; k+ 3.7; na++ 142; liver normal albumin 2.3 10-07-15: glucose 97; bun 20; creat 0.35; k+ 4.3; na++ 137     Review of Systems  Constitutional: Negative for malaise/fatigue.  Respiratory: Negative for cough and shortness of breath.   Cardiovascular: Negative for chest pain, palpitations and leg swelling.  Gastrointestinal: Negative for heartburn, abdominal pain and constipation.  Musculoskeletal: Positive for joint pain. Negative for myalgias and back pain.       Has left hip left wrist pain   Skin: Negative.   Neurological: Negative for dizziness.    Psychiatric/Behavioral: The patient is not nervous/anxious.       Physical Exam  Constitutional: No distress.  Very frail   Eyes: Conjunctivae are normal.  Neck: Neck supple. No JVD present. No thyromegaly present.  Cardiovascular: Normal rate, regular rhythm and intact distal pulses.   Respiratory: Effort normal and breath sounds normal. No respiratory distress. She has no wheezes.  GI: Soft. Bowel sounds are normal. She exhibits no distension. There is no tenderness.  Musculoskeletal: She exhibits no edema.  Able to move all extremities  Is status post left hip fracture  Left wrist in ace cast is able to move fingers has rapid capillary refill   Lymphadenopathy:    She has no cervical adenopathy.  Neurological: She is alert.  Skin: Skin is warm and dry. She is not  diaphoretic.  Psychiatric: She has a normal mood and affect.     ASSESSMENT/ PLAN:   Patient is being discharged with the following home health services:  None required is going to snf  Patient is being discharged with the following durable medical equipment:  None required is going to snf  Patient has been advised to f/u with their PCP in 1-2 weeks to bring them up to date on their rehab stay.  Social services at facility was responsible for arranging this appointment.  Pt was provided with a 30 day supply of prescriptions for medications and refills must be obtained from their PCP.  For controlled substances, a more limited supply may be provided adequate until PCP appointment only. #60 vicodin 5.325 mg tabs to snf   Time spent with patient 40   minutes >50% time spent counseling; reviewing medical record; tests; labs; and developing future plan of care    Synthia InnocentDeborah Green NP [facility]  Contact [phone] Monday through Friday 8am- 5pm  After hours call [phone]

## 2015-10-13 ENCOUNTER — Encounter: Payer: Self-pay | Admitting: Internal Medicine

## 2015-10-13 DIAGNOSIS — L89159 Pressure ulcer of sacral region, unspecified stage: Secondary | ICD-10-CM | POA: Diagnosis not present

## 2015-10-13 DIAGNOSIS — K5900 Constipation, unspecified: Secondary | ICD-10-CM | POA: Insufficient documentation

## 2015-10-13 DIAGNOSIS — F419 Anxiety disorder, unspecified: Secondary | ICD-10-CM

## 2015-10-13 DIAGNOSIS — G301 Alzheimer's disease with late onset: Secondary | ICD-10-CM | POA: Diagnosis not present

## 2015-10-13 DIAGNOSIS — F329 Major depressive disorder, single episode, unspecified: Secondary | ICD-10-CM | POA: Insufficient documentation

## 2015-10-13 DIAGNOSIS — S72002A Fracture of unspecified part of neck of left femur, initial encounter for closed fracture: Secondary | ICD-10-CM | POA: Diagnosis not present

## 2015-10-13 DIAGNOSIS — E46 Unspecified protein-calorie malnutrition: Secondary | ICD-10-CM | POA: Diagnosis not present

## 2015-10-23 DIAGNOSIS — S72142D Displaced intertrochanteric fracture of left femur, subsequent encounter for closed fracture with routine healing: Secondary | ICD-10-CM | POA: Diagnosis not present

## 2015-10-23 DIAGNOSIS — S52532D Colles' fracture of left radius, subsequent encounter for closed fracture with routine healing: Secondary | ICD-10-CM | POA: Diagnosis not present

## 2015-10-31 NOTE — Discharge Summary (Signed)
Physician Discharge Summary  Kristen Clark ION:[id number] DOB: 1929-07-07 DOA: 10/01/2015  PCP: Velna Hatchet, MD  Admit date: 10/01/2015 Discharge date: 10/07/2015  Time spent: 25 minutes  Recommendations for Outpatient Follow-up:  1. Follow up Orthopedics in 2 weeks   Discharge Diagnoses:  Principal Problem:   Fracture of femoral neck, left (HCC) Active Problems:   Left radial fracture   Acute hyperglycemia   Osteopenia   Dementia   Severe protein-calorie malnutrition Kristen Clark: less than 60% of standard weight) (HCC)   Abnormal ECG   Vitamin D deficiency   Normocytic anemia   Pressure ulcer   Palliative care encounter   Goals of care, counseling/discussion   Hip fracture ([name])   Anemia due to other cause   Discharge Condition: Stable  Diet recommendation: Regular  Filed Weights   10/01/15 1835  Weight: 28.214 kg (62 lb 3.2 oz)    History of present illness:  82 y.o. female with medical history significant for dementia on Aricept, significant protein calorie malnutrition with continued weight loss, vitamin D deficiency and osteopenia who presents to the ER after experiencing mechanical fall yesterday. Patient had been visiting a family friend along with her daughter when the family friend's dog rushed the patient knocking her legs out from under her and she fell on her left side. She was having some pain but not significant enough to warrant the [facility] medical attention. Unfortunately after waking up sweating the pain was very severe and patient decided to seek medical treatment.   Hospital Course:   Fracture of femoral neck, left radius fracture after mechanical fall, s/p ORIF -Comminuted hip fracture and patient with witnessed mechanical fall and known osteopenia and vitamin D deficiency  Abnormal ECG -Voltage criteria met for LVH and there was a question of cardiomegaly on the chest x-ray - Echocardiogram showed EF 60- 65%.  Leukocytosis Improved, wbc  is 15000 -Likely related to acute stressors of pain and injury -UA is negative, she is afebrile.   Osteopenia/Vitamin D deficiency -She was on vitamin D as well as calcium supplementation prior to the patient -Follow up on calcium, albumin and vitamin D labs obtained at admission   Dementia -Continue preadmission Aricept   Severe protein-calorie malnutrition Kristen Clark: less than 60% of standard weight)  -Chronic ongoing problem and likely related to patient's advancing dementia -Was taking Ensure Plus one time daily at home -Continue nutritional supplements but increase to twice a day while acutely stressed for surgery and hospitalization -Attrition consultation   Normocytic anemia - today hemoglobin is 10.8 - Status post transfusion of one unit PRBC  -Baseline hemoglobin unknown -On iron supplementation prior to admission -Anemia panel : B12 1316.    Procedures:  ORIF  Consultations:  Orthopedics  Discharge Exam:     Filed Vitals:   10/06/15 2032 10/07/15 0451  BP: 106/54 134/89  Pulse: 121 103  Temp: 98 F (36.7 C) 98.2 F (36.8 C)  Resp: 16 21    General: Appears in no acute distress Cardiovascular: S1S2 RRR Respiratory: Clear bilaterally  Discharge Instructions       Discharge Instructions    Diet - low sodium heart healthy    Complete by:  As directed      Increase activity slowly    Complete by:  As directed      Non weight bearing    Complete by:  As directed              Current Discharge Medication List    START  taking these medications   Details  docusate sodium (COLACE) 100 MG capsule Take 1 capsule (100 mg total) by mouth 2 (two) times daily. Qty: 10 capsule, Refills: 0    heparin 5000 UNIT/ML injection Inject 1 mL (5,000 Units total) into the skin every 8 (eight) hours. Qty: 1 mL, Refills: 90    HYDROcodone-acetaminophen (NORCO/VICODIN) 5-325 MG tablet Take 1-2 tablets by mouth every 6 (six)  hours as needed for moderate pain. Qty: 30 tablet, Refills: 0    ondansetron (ZOFRAN) 4 MG tablet Take 1 tablet (4 mg total) by mouth every 6 (six) hours as needed for nausea. Qty: 20 tablet, Refills: 0      CONTINUE these medications which have NOT CHANGED   Details  cholecalciferol (VITAMIN D) 1000 units tablet Take 1,000 Units by mouth daily.    Cyanocobalamin (VITAMIN B 12 PO) Take 1 tablet by mouth daily.    donepezil (ARICEPT) 10 MG tablet Take 10 mg by mouth at bedtime.     ferrous sulfate 325 (65 FE) MG tablet Take 325 mg by mouth daily with breakfast.    Multiple Vitamins-Minerals (MULTIVITAMIN WITH MINERALS) tablet Take 1 tablet by mouth daily.    vitamin A 10000 UNIT capsule Take 10,000 Units by mouth daily.       No Known Allergies    Follow-up Information    Follow up with Meredith Pel, MD In 2 weeks.   Specialty:  Orthopedic Surgery   Contact information:   [place] [address] [phone]        The results of significant diagnostics from this hospitalization (including imaging, microbiology, ancillary and laboratory) are listed below for reference.    Significant Diagnostic Studies:  Imaging Results  Dg Chest 2 View  10/01/2015  CLINICAL DATA:  Fall yesterday walking the dog EXAM: CHEST  2 VIEW COMPARISON:  None. FINDINGS: Borderline cardiomegaly. Hyperinflation is noted. No infiltrate or pulmonary edema. Thoracic spine osteopenia. No gross fractures are noted. No pneumothorax. IMPRESSION: No active disease. Hyperinflation. Cardiomegaly. Thoracic spine osteopenia. No gross fractures. No pneumothorax. Electronically Signed   By: Lahoma Crocker M.D.   On: 10/01/2015 14:00   Dg Wrist 2 Views Left  10/02/2015  CLINICAL DATA:  ORIF left wrist fracture EXAM: LEFT WRIST - 2 VIEW COMPARISON:  None FLUOROSCOPY TIME:  30 seconds FINDINGS: Distal left radial metaphysis fracture transfixed with a volar side plate and  multiple interlocking screws with the fracture in near anatomic alignment. No other fracture or dislocation. IMPRESSION: Interval ORIF left distal radial fracture. Electronically Signed   By: Kathreen Devoid   On: 10/02/2015 21:32   Dg Wrist Complete Left  10/01/2015  CLINICAL DATA:  Status post fall a few days ago with a left wrist injury. Pain and swelling. Initial encounter. EXAM: LEFT WRIST - COMPLETE 3+ VIEW COMPARISON:  None. FINDINGS: The patient has a fracture of the distal radius with intra-articular involvement. There is impaction of the volar aspect of the radius above 0.6 cm. The fracture appears mildly comminuted. No other acute bony or joint abnormality is seen. Bones are osteopenic. Atherosclerosis is noted. Soft tissue swelling is present about the wrist. First CMC and scaphoid trapezium trapezoid joint osteoarthritis is noted. IMPRESSION: Impacted distal radius fracture involves articular surface. Osteopenia. Atherosclerosis. Electronically Signed   By: Inge Rise M.D.   On: 10/01/2015 12:20   Ct Head Wo Contrast  10/01/2015  CLINICAL DATA:  82 year old female with a history of fall EXAM: CT HEAD  WITHOUT CONTRAST TECHNIQUE: Contiguous axial images were obtained from the base of the skull through the vertex without intravenous contrast. COMPARISON:  None. FINDINGS: Unremarkable appearance of the calvarium without acute fracture or aggressive lesion. Unremarkable appearance of the scalp soft tissues. Unremarkable appearance of the bilateral orbits. Mastoid air cells are clear. No significant paranasal sinus disease No acute intracranial hemorrhage. No midline shift or mass effect. Gray-white differentiation relatively maintained. Confluent hypodensity in the periventricular white matter. Mild brain volume loss. Suggesting calcifications. IMPRESSION: No CT evidence of acute intracranial abnormality. Senescent brain volume loss and chronic white matter disease. Signed, Dulcy Fanny. Earleen Newport, DO  [facility] Electronically Signed   By: Corrie Mckusick D.O.   On: 10/01/2015 13:44   Pelvis Portable  10/02/2015  CLINICAL DATA:  Post left IM femoral nail. EXAM: PORTABLE PELVIS 1-2 VIEWS COMPARISON:  Intraoperative fluoroscopy 10/02/2015 FINDINGS: Postoperative changes with long intra medullary rod and compression screw fixation of the inter trochanteric fractures of the proximal left femur. The distal portion of the intra medullary rod is not included within the field of view. Visualized components appear well seated. No dislocation at the hip joint. Subcutaneous emphysema is likely due to recent surgery. Prominent vascular calcifications are present. IMPRESSION: Intra medullary rod and compression screw fixation of intertrochanteric fracture of the proximal left femur. Electronically Signed   By: Lucienne Capers M.D.   On: 10/02/2015 21:43   Dg C-arm 1-60 Min  10/02/2015  CLINICAL DATA:  ORIF left hip fracture EXAM: DG C-ARM 61-120 MIN; LEFT FEMUR 2 VIEWS COMPARISON:  None FLUOROSCOPY TIME:  30 seconds FINDINGS: Left intertrochanteric fracture transfixed with an intramedullary nail and 2 cannulated femoral neck screws. The fracture is in near anatomic alignment. IMPRESSION: ORIF left intertrochanteric fracture. Electronically Signed   By: Kathreen Devoid   On: 10/02/2015 20:36   Dg Hip Unilat With Pelvis 2-3 Views Left  10/01/2015  CLINICAL DATA:  82 year old female with a history of fall a few days prior. Ongoing hip pain EXAM: DG HIP (WITH OR WITHOUT PELVIS) 2-3V LEFT COMPARISON:  None. FINDINGS: Diffuse osteopenia. Acute fracture of the left femoral neck, with proximal migration of the femur fracture fragment. Bony pelvic ring appears intact with no pelvic fracture identified. Degenerative changes of the lower lumbar spine. Degenerative changes the bilateral hips. Extensive vascular calcifications. IMPRESSION: Acute left femoral neck  fracture with angulation at the fracture site and proximal migration of the distal fracture fragment. Atherosclerosis Signed, Dulcy Fanny. Earleen Newport, DO [facility]iology Electronically Signed   By: Corrie Mckusick D.O.   On: 10/01/2015 12:25   Dg Femur Min 2 Views Left  10/02/2015  CLINICAL DATA:  ORIF left hip fracture EXAM: DG C-ARM 61-120 MIN; LEFT FEMUR 2 VIEWS COMPARISON:  None FLUOROSCOPY TIME:  30 seconds FINDINGS: Left intertrochanteric fracture transfixed with an intramedullary nail and 2 cannulated femoral neck screws. The fracture is in near anatomic alignment. IMPRESSION: ORIF left intertrochanteric fracture. Electronically Signed   By: Kathreen Devoid   On: 10/02/2015 20:36     Microbiology:        Recent Results (from the past 240 hour(s))  MRSA PCR Screening     Status: None   Collection Time: 10/01/15  4:47 PM  Result Value Ref Range Status   MRSA by PCR NEGATIVE NEGATIVE Final    Comment:        The GeneXpert MRSA Assay (FDA approved for NASAL specimens only), is  one component of a comprehensive MRSA colonization surveillance program. It is not intended to diagnose MRSA infection nor to guide or monitor treatment for MRSA infections.  Urine culture     Status: Abnormal   Collection Time: 10/02/15  6:14 AM  Result Value Ref Range Status   Specimen Description URINE, RANDOM  Final   Special Requests NONE  Final   Culture <10,000 COLONIES/mL INSIGNIFICANT GROWTH (A)  Final   Report Status 10/03/2015 FINAL  Final     Labs: Basic Metabolic Panel:  Last Labs    Recent Labs Lab 10/02/15 0711 10/04/15 0414 10/05/15 0423 10/06/15 0423 10/07/15 0359  NA 139 139 142 141 137  K 4.1 4.1 3.7 3.4* 4.3  CL 103 103 103 106 107  CO2 27 25 28 27 26   GLUCOSE 107* 116* 109* 91 97  BUN 24* 20 24* 27* 20  CREATININE 0.60 0.38* 0.50 0.35* 0.35*  CALCIUM 8.3* 7.7* 8.0* 7.5* 7.4*     Liver Function  Tests:  Last Labs    Recent Labs Lab 10/01/15 1515 10/05/15 0423  AST  --  21  ALT  --  16  ALKPHOS  --  116  BILITOT  --  1.6*  PROT  --  5.2*  ALBUMIN 3.5 2.3*     Last Labs   No results for input(s): LIPASE, AMYLASE in the last 168 hours.   Last Labs   No results for input(s): AMMONIA in the last 168 hours.   CBC:  Last Labs    Recent Labs Lab 10/01/15 1244 10/01/15 1258 10/02/15 0711 10/04/15 0414 10/05/15 0423 10/06/15 0423  WBC 18.9*  --  19.1* 19.4* 18.2* 15.3*  HGB 10.1* 11.9* 9.2* 7.9* 10.8* 10.1*  HCT 33.7* 35.0* 30.3* 26.6* 34.4* 32.9*  MCV 88.5  --  87.1 89.3 86.9 87.7  PLT 368  --  398 333 427* 387     Cardiac Enzymes:   Signed:  Oswald Hillock MD.  [facility] 10/07/2015, 11:34 AM

## 2015-11-03 DIAGNOSIS — D649 Anemia, unspecified: Secondary | ICD-10-CM | POA: Diagnosis not present

## 2015-11-03 DIAGNOSIS — G301 Alzheimer's disease with late onset: Secondary | ICD-10-CM | POA: Diagnosis not present

## 2015-11-03 DIAGNOSIS — L89159 Pressure ulcer of sacral region, unspecified stage: Secondary | ICD-10-CM | POA: Diagnosis not present

## 2015-11-03 DIAGNOSIS — E43 Unspecified severe protein-calorie malnutrition: Secondary | ICD-10-CM | POA: Diagnosis not present

## 2015-11-08 DIAGNOSIS — S72142D Displaced intertrochanteric fracture of left femur, subsequent encounter for closed fracture with routine healing: Secondary | ICD-10-CM | POA: Diagnosis not present

## 2015-11-08 DIAGNOSIS — S52532D Colles' fracture of left radius, subsequent encounter for closed fracture with routine healing: Secondary | ICD-10-CM | POA: Diagnosis not present

## 2015-11-27 DIAGNOSIS — Z7982 Long term (current) use of aspirin: Secondary | ICD-10-CM | POA: Diagnosis not present

## 2015-11-27 DIAGNOSIS — E440 Moderate protein-calorie malnutrition: Secondary | ICD-10-CM | POA: Diagnosis not present

## 2015-11-27 DIAGNOSIS — F0280 Dementia in other diseases classified elsewhere without behavioral disturbance: Secondary | ICD-10-CM | POA: Diagnosis not present

## 2015-11-27 DIAGNOSIS — R1311 Dysphagia, oral phase: Secondary | ICD-10-CM | POA: Diagnosis not present

## 2015-11-27 DIAGNOSIS — F419 Anxiety disorder, unspecified: Secondary | ICD-10-CM | POA: Diagnosis not present

## 2015-11-27 DIAGNOSIS — S72002D Fracture of unspecified part of neck of left femur, subsequent encounter for closed fracture with routine healing: Secondary | ICD-10-CM | POA: Diagnosis not present

## 2015-11-27 DIAGNOSIS — F418 Other specified anxiety disorders: Secondary | ICD-10-CM | POA: Diagnosis not present

## 2015-11-27 DIAGNOSIS — G308 Other Alzheimer's disease: Secondary | ICD-10-CM | POA: Diagnosis not present

## 2015-11-27 DIAGNOSIS — S52512D Displaced fracture of left radial styloid process, subsequent encounter for closed fracture with routine healing: Secondary | ICD-10-CM | POA: Diagnosis not present

## 2015-11-27 DIAGNOSIS — M6281 Muscle weakness (generalized): Secondary | ICD-10-CM | POA: Diagnosis not present

## 2015-11-27 DIAGNOSIS — G309 Alzheimer's disease, unspecified: Secondary | ICD-10-CM | POA: Diagnosis not present

## 2015-11-27 DIAGNOSIS — Z9181 History of falling: Secondary | ICD-10-CM | POA: Diagnosis not present

## 2015-11-28 DIAGNOSIS — F0280 Dementia in other diseases classified elsewhere without behavioral disturbance: Secondary | ICD-10-CM | POA: Diagnosis not present

## 2015-11-28 DIAGNOSIS — S72002D Fracture of unspecified part of neck of left femur, subsequent encounter for closed fracture with routine healing: Secondary | ICD-10-CM | POA: Diagnosis not present

## 2015-11-28 DIAGNOSIS — M6281 Muscle weakness (generalized): Secondary | ICD-10-CM | POA: Diagnosis not present

## 2015-11-28 DIAGNOSIS — G309 Alzheimer's disease, unspecified: Secondary | ICD-10-CM | POA: Diagnosis not present

## 2015-11-28 DIAGNOSIS — S52512D Displaced fracture of left radial styloid process, subsequent encounter for closed fracture with routine healing: Secondary | ICD-10-CM | POA: Diagnosis not present

## 2015-11-28 DIAGNOSIS — R1311 Dysphagia, oral phase: Secondary | ICD-10-CM | POA: Diagnosis not present

## 2015-11-29 DIAGNOSIS — S52512D Displaced fracture of left radial styloid process, subsequent encounter for closed fracture with routine healing: Secondary | ICD-10-CM | POA: Diagnosis not present

## 2015-11-29 DIAGNOSIS — F0280 Dementia in other diseases classified elsewhere without behavioral disturbance: Secondary | ICD-10-CM | POA: Diagnosis not present

## 2015-11-29 DIAGNOSIS — M6281 Muscle weakness (generalized): Secondary | ICD-10-CM | POA: Diagnosis not present

## 2015-11-29 DIAGNOSIS — S72002D Fracture of unspecified part of neck of left femur, subsequent encounter for closed fracture with routine healing: Secondary | ICD-10-CM | POA: Diagnosis not present

## 2015-11-29 DIAGNOSIS — G309 Alzheimer's disease, unspecified: Secondary | ICD-10-CM | POA: Diagnosis not present

## 2015-11-29 DIAGNOSIS — R1311 Dysphagia, oral phase: Secondary | ICD-10-CM | POA: Diagnosis not present

## 2015-12-02 DIAGNOSIS — S72002D Fracture of unspecified part of neck of left femur, subsequent encounter for closed fracture with routine healing: Secondary | ICD-10-CM | POA: Diagnosis not present

## 2015-12-02 DIAGNOSIS — S52512D Displaced fracture of left radial styloid process, subsequent encounter for closed fracture with routine healing: Secondary | ICD-10-CM | POA: Diagnosis not present

## 2015-12-02 DIAGNOSIS — G309 Alzheimer's disease, unspecified: Secondary | ICD-10-CM | POA: Diagnosis not present

## 2015-12-02 DIAGNOSIS — M6281 Muscle weakness (generalized): Secondary | ICD-10-CM | POA: Diagnosis not present

## 2015-12-02 DIAGNOSIS — F0280 Dementia in other diseases classified elsewhere without behavioral disturbance: Secondary | ICD-10-CM | POA: Diagnosis not present

## 2015-12-02 DIAGNOSIS — R1311 Dysphagia, oral phase: Secondary | ICD-10-CM | POA: Diagnosis not present

## 2015-12-03 DIAGNOSIS — R1311 Dysphagia, oral phase: Secondary | ICD-10-CM | POA: Diagnosis not present

## 2015-12-03 DIAGNOSIS — M6281 Muscle weakness (generalized): Secondary | ICD-10-CM | POA: Diagnosis not present

## 2015-12-03 DIAGNOSIS — S52512D Displaced fracture of left radial styloid process, subsequent encounter for closed fracture with routine healing: Secondary | ICD-10-CM | POA: Diagnosis not present

## 2015-12-03 DIAGNOSIS — G309 Alzheimer's disease, unspecified: Secondary | ICD-10-CM | POA: Diagnosis not present

## 2015-12-03 DIAGNOSIS — S72002D Fracture of unspecified part of neck of left femur, subsequent encounter for closed fracture with routine healing: Secondary | ICD-10-CM | POA: Diagnosis not present

## 2015-12-03 DIAGNOSIS — F0280 Dementia in other diseases classified elsewhere without behavioral disturbance: Secondary | ICD-10-CM | POA: Diagnosis not present

## 2015-12-04 DIAGNOSIS — F0280 Dementia in other diseases classified elsewhere without behavioral disturbance: Secondary | ICD-10-CM | POA: Diagnosis not present

## 2015-12-04 DIAGNOSIS — S52512D Displaced fracture of left radial styloid process, subsequent encounter for closed fracture with routine healing: Secondary | ICD-10-CM | POA: Diagnosis not present

## 2015-12-04 DIAGNOSIS — R1311 Dysphagia, oral phase: Secondary | ICD-10-CM | POA: Diagnosis not present

## 2015-12-04 DIAGNOSIS — M6281 Muscle weakness (generalized): Secondary | ICD-10-CM | POA: Diagnosis not present

## 2015-12-04 DIAGNOSIS — G309 Alzheimer's disease, unspecified: Secondary | ICD-10-CM | POA: Diagnosis not present

## 2015-12-04 DIAGNOSIS — S72002D Fracture of unspecified part of neck of left femur, subsequent encounter for closed fracture with routine healing: Secondary | ICD-10-CM | POA: Diagnosis not present

## 2015-12-05 DIAGNOSIS — G309 Alzheimer's disease, unspecified: Secondary | ICD-10-CM | POA: Diagnosis not present

## 2015-12-05 DIAGNOSIS — R1311 Dysphagia, oral phase: Secondary | ICD-10-CM | POA: Diagnosis not present

## 2015-12-05 DIAGNOSIS — F0280 Dementia in other diseases classified elsewhere without behavioral disturbance: Secondary | ICD-10-CM | POA: Diagnosis not present

## 2015-12-05 DIAGNOSIS — M6281 Muscle weakness (generalized): Secondary | ICD-10-CM | POA: Diagnosis not present

## 2015-12-05 DIAGNOSIS — S52512D Displaced fracture of left radial styloid process, subsequent encounter for closed fracture with routine healing: Secondary | ICD-10-CM | POA: Diagnosis not present

## 2015-12-05 DIAGNOSIS — S72002D Fracture of unspecified part of neck of left femur, subsequent encounter for closed fracture with routine healing: Secondary | ICD-10-CM | POA: Diagnosis not present

## 2015-12-06 DIAGNOSIS — F0280 Dementia in other diseases classified elsewhere without behavioral disturbance: Secondary | ICD-10-CM | POA: Diagnosis not present

## 2015-12-06 DIAGNOSIS — S52512D Displaced fracture of left radial styloid process, subsequent encounter for closed fracture with routine healing: Secondary | ICD-10-CM | POA: Diagnosis not present

## 2015-12-06 DIAGNOSIS — R1311 Dysphagia, oral phase: Secondary | ICD-10-CM | POA: Diagnosis not present

## 2015-12-06 DIAGNOSIS — S72002D Fracture of unspecified part of neck of left femur, subsequent encounter for closed fracture with routine healing: Secondary | ICD-10-CM | POA: Diagnosis not present

## 2015-12-06 DIAGNOSIS — G309 Alzheimer's disease, unspecified: Secondary | ICD-10-CM | POA: Diagnosis not present

## 2015-12-06 DIAGNOSIS — M6281 Muscle weakness (generalized): Secondary | ICD-10-CM | POA: Diagnosis not present

## 2015-12-09 DIAGNOSIS — M6281 Muscle weakness (generalized): Secondary | ICD-10-CM | POA: Diagnosis not present

## 2015-12-09 DIAGNOSIS — R1311 Dysphagia, oral phase: Secondary | ICD-10-CM | POA: Diagnosis not present

## 2015-12-09 DIAGNOSIS — S52512D Displaced fracture of left radial styloid process, subsequent encounter for closed fracture with routine healing: Secondary | ICD-10-CM | POA: Diagnosis not present

## 2015-12-09 DIAGNOSIS — G309 Alzheimer's disease, unspecified: Secondary | ICD-10-CM | POA: Diagnosis not present

## 2015-12-09 DIAGNOSIS — S72002D Fracture of unspecified part of neck of left femur, subsequent encounter for closed fracture with routine healing: Secondary | ICD-10-CM | POA: Diagnosis not present

## 2015-12-09 DIAGNOSIS — F0280 Dementia in other diseases classified elsewhere without behavioral disturbance: Secondary | ICD-10-CM | POA: Diagnosis not present

## 2015-12-10 DIAGNOSIS — G309 Alzheimer's disease, unspecified: Secondary | ICD-10-CM | POA: Diagnosis not present

## 2015-12-10 DIAGNOSIS — R1311 Dysphagia, oral phase: Secondary | ICD-10-CM | POA: Diagnosis not present

## 2015-12-10 DIAGNOSIS — F0280 Dementia in other diseases classified elsewhere without behavioral disturbance: Secondary | ICD-10-CM | POA: Diagnosis not present

## 2015-12-10 DIAGNOSIS — M6281 Muscle weakness (generalized): Secondary | ICD-10-CM | POA: Diagnosis not present

## 2015-12-10 DIAGNOSIS — S52512D Displaced fracture of left radial styloid process, subsequent encounter for closed fracture with routine healing: Secondary | ICD-10-CM | POA: Diagnosis not present

## 2015-12-10 DIAGNOSIS — S72002D Fracture of unspecified part of neck of left femur, subsequent encounter for closed fracture with routine healing: Secondary | ICD-10-CM | POA: Diagnosis not present

## 2015-12-11 DIAGNOSIS — M6281 Muscle weakness (generalized): Secondary | ICD-10-CM | POA: Diagnosis not present

## 2015-12-11 DIAGNOSIS — F0280 Dementia in other diseases classified elsewhere without behavioral disturbance: Secondary | ICD-10-CM | POA: Diagnosis not present

## 2015-12-11 DIAGNOSIS — S72002D Fracture of unspecified part of neck of left femur, subsequent encounter for closed fracture with routine healing: Secondary | ICD-10-CM | POA: Diagnosis not present

## 2015-12-11 DIAGNOSIS — S52512D Displaced fracture of left radial styloid process, subsequent encounter for closed fracture with routine healing: Secondary | ICD-10-CM | POA: Diagnosis not present

## 2015-12-11 DIAGNOSIS — R1311 Dysphagia, oral phase: Secondary | ICD-10-CM | POA: Diagnosis not present

## 2015-12-11 DIAGNOSIS — G309 Alzheimer's disease, unspecified: Secondary | ICD-10-CM | POA: Diagnosis not present

## 2015-12-13 DIAGNOSIS — G309 Alzheimer's disease, unspecified: Secondary | ICD-10-CM | POA: Diagnosis not present

## 2015-12-13 DIAGNOSIS — F0280 Dementia in other diseases classified elsewhere without behavioral disturbance: Secondary | ICD-10-CM | POA: Diagnosis not present

## 2015-12-13 DIAGNOSIS — S52512D Displaced fracture of left radial styloid process, subsequent encounter for closed fracture with routine healing: Secondary | ICD-10-CM | POA: Diagnosis not present

## 2015-12-13 DIAGNOSIS — S72002D Fracture of unspecified part of neck of left femur, subsequent encounter for closed fracture with routine healing: Secondary | ICD-10-CM | POA: Diagnosis not present

## 2015-12-13 DIAGNOSIS — M6281 Muscle weakness (generalized): Secondary | ICD-10-CM | POA: Diagnosis not present

## 2015-12-13 DIAGNOSIS — R1311 Dysphagia, oral phase: Secondary | ICD-10-CM | POA: Diagnosis not present

## 2015-12-16 DIAGNOSIS — G309 Alzheimer's disease, unspecified: Secondary | ICD-10-CM | POA: Diagnosis not present

## 2015-12-16 DIAGNOSIS — R1311 Dysphagia, oral phase: Secondary | ICD-10-CM | POA: Diagnosis not present

## 2015-12-16 DIAGNOSIS — M6281 Muscle weakness (generalized): Secondary | ICD-10-CM | POA: Diagnosis not present

## 2015-12-16 DIAGNOSIS — F0280 Dementia in other diseases classified elsewhere without behavioral disturbance: Secondary | ICD-10-CM | POA: Diagnosis not present

## 2015-12-16 DIAGNOSIS — S72002D Fracture of unspecified part of neck of left femur, subsequent encounter for closed fracture with routine healing: Secondary | ICD-10-CM | POA: Diagnosis not present

## 2015-12-16 DIAGNOSIS — S52512D Displaced fracture of left radial styloid process, subsequent encounter for closed fracture with routine healing: Secondary | ICD-10-CM | POA: Diagnosis not present

## 2015-12-18 DIAGNOSIS — M6281 Muscle weakness (generalized): Secondary | ICD-10-CM | POA: Diagnosis not present

## 2015-12-18 DIAGNOSIS — S52512D Displaced fracture of left radial styloid process, subsequent encounter for closed fracture with routine healing: Secondary | ICD-10-CM | POA: Diagnosis not present

## 2015-12-18 DIAGNOSIS — G309 Alzheimer's disease, unspecified: Secondary | ICD-10-CM | POA: Diagnosis not present

## 2015-12-18 DIAGNOSIS — F0280 Dementia in other diseases classified elsewhere without behavioral disturbance: Secondary | ICD-10-CM | POA: Diagnosis not present

## 2015-12-18 DIAGNOSIS — R1311 Dysphagia, oral phase: Secondary | ICD-10-CM | POA: Diagnosis not present

## 2015-12-18 DIAGNOSIS — S72002D Fracture of unspecified part of neck of left femur, subsequent encounter for closed fracture with routine healing: Secondary | ICD-10-CM | POA: Diagnosis not present

## 2015-12-19 DIAGNOSIS — S72002D Fracture of unspecified part of neck of left femur, subsequent encounter for closed fracture with routine healing: Secondary | ICD-10-CM | POA: Diagnosis not present

## 2015-12-19 DIAGNOSIS — M6281 Muscle weakness (generalized): Secondary | ICD-10-CM | POA: Diagnosis not present

## 2015-12-19 DIAGNOSIS — F0280 Dementia in other diseases classified elsewhere without behavioral disturbance: Secondary | ICD-10-CM | POA: Diagnosis not present

## 2015-12-19 DIAGNOSIS — G309 Alzheimer's disease, unspecified: Secondary | ICD-10-CM | POA: Diagnosis not present

## 2015-12-19 DIAGNOSIS — S52512D Displaced fracture of left radial styloid process, subsequent encounter for closed fracture with routine healing: Secondary | ICD-10-CM | POA: Diagnosis not present

## 2015-12-19 DIAGNOSIS — R1311 Dysphagia, oral phase: Secondary | ICD-10-CM | POA: Diagnosis not present

## 2015-12-20 DIAGNOSIS — M6281 Muscle weakness (generalized): Secondary | ICD-10-CM | POA: Diagnosis not present

## 2015-12-20 DIAGNOSIS — S72002D Fracture of unspecified part of neck of left femur, subsequent encounter for closed fracture with routine healing: Secondary | ICD-10-CM | POA: Diagnosis not present

## 2015-12-20 DIAGNOSIS — S72142D Displaced intertrochanteric fracture of left femur, subsequent encounter for closed fracture with routine healing: Secondary | ICD-10-CM | POA: Diagnosis not present

## 2015-12-20 DIAGNOSIS — G309 Alzheimer's disease, unspecified: Secondary | ICD-10-CM | POA: Diagnosis not present

## 2015-12-20 DIAGNOSIS — F0280 Dementia in other diseases classified elsewhere without behavioral disturbance: Secondary | ICD-10-CM | POA: Diagnosis not present

## 2015-12-20 DIAGNOSIS — S52532D Colles' fracture of left radius, subsequent encounter for closed fracture with routine healing: Secondary | ICD-10-CM | POA: Diagnosis not present

## 2015-12-20 DIAGNOSIS — S52512D Displaced fracture of left radial styloid process, subsequent encounter for closed fracture with routine healing: Secondary | ICD-10-CM | POA: Diagnosis not present

## 2015-12-20 DIAGNOSIS — R1311 Dysphagia, oral phase: Secondary | ICD-10-CM | POA: Diagnosis not present

## 2015-12-23 DIAGNOSIS — M6281 Muscle weakness (generalized): Secondary | ICD-10-CM | POA: Diagnosis not present

## 2015-12-23 DIAGNOSIS — F0280 Dementia in other diseases classified elsewhere without behavioral disturbance: Secondary | ICD-10-CM | POA: Diagnosis not present

## 2015-12-23 DIAGNOSIS — G309 Alzheimer's disease, unspecified: Secondary | ICD-10-CM | POA: Diagnosis not present

## 2015-12-23 DIAGNOSIS — R1311 Dysphagia, oral phase: Secondary | ICD-10-CM | POA: Diagnosis not present

## 2015-12-23 DIAGNOSIS — S72002D Fracture of unspecified part of neck of left femur, subsequent encounter for closed fracture with routine healing: Secondary | ICD-10-CM | POA: Diagnosis not present

## 2015-12-23 DIAGNOSIS — S52512D Displaced fracture of left radial styloid process, subsequent encounter for closed fracture with routine healing: Secondary | ICD-10-CM | POA: Diagnosis not present

## 2015-12-25 DIAGNOSIS — F0280 Dementia in other diseases classified elsewhere without behavioral disturbance: Secondary | ICD-10-CM | POA: Diagnosis not present

## 2015-12-25 DIAGNOSIS — G309 Alzheimer's disease, unspecified: Secondary | ICD-10-CM | POA: Diagnosis not present

## 2015-12-25 DIAGNOSIS — R1311 Dysphagia, oral phase: Secondary | ICD-10-CM | POA: Diagnosis not present

## 2015-12-25 DIAGNOSIS — M6281 Muscle weakness (generalized): Secondary | ICD-10-CM | POA: Diagnosis not present

## 2015-12-25 DIAGNOSIS — S72002D Fracture of unspecified part of neck of left femur, subsequent encounter for closed fracture with routine healing: Secondary | ICD-10-CM | POA: Diagnosis not present

## 2015-12-25 DIAGNOSIS — S52512D Displaced fracture of left radial styloid process, subsequent encounter for closed fracture with routine healing: Secondary | ICD-10-CM | POA: Diagnosis not present

## 2015-12-30 DIAGNOSIS — S52512D Displaced fracture of left radial styloid process, subsequent encounter for closed fracture with routine healing: Secondary | ICD-10-CM | POA: Diagnosis not present

## 2015-12-30 DIAGNOSIS — F0280 Dementia in other diseases classified elsewhere without behavioral disturbance: Secondary | ICD-10-CM | POA: Diagnosis not present

## 2015-12-30 DIAGNOSIS — G309 Alzheimer's disease, unspecified: Secondary | ICD-10-CM | POA: Diagnosis not present

## 2015-12-30 DIAGNOSIS — S72002D Fracture of unspecified part of neck of left femur, subsequent encounter for closed fracture with routine healing: Secondary | ICD-10-CM | POA: Diagnosis not present

## 2015-12-30 DIAGNOSIS — R1311 Dysphagia, oral phase: Secondary | ICD-10-CM | POA: Diagnosis not present

## 2015-12-30 DIAGNOSIS — M6281 Muscle weakness (generalized): Secondary | ICD-10-CM | POA: Diagnosis not present

## 2016-01-01 DIAGNOSIS — S52512D Displaced fracture of left radial styloid process, subsequent encounter for closed fracture with routine healing: Secondary | ICD-10-CM | POA: Diagnosis not present

## 2016-01-01 DIAGNOSIS — R1311 Dysphagia, oral phase: Secondary | ICD-10-CM | POA: Diagnosis not present

## 2016-01-01 DIAGNOSIS — G309 Alzheimer's disease, unspecified: Secondary | ICD-10-CM | POA: Diagnosis not present

## 2016-01-01 DIAGNOSIS — S72002D Fracture of unspecified part of neck of left femur, subsequent encounter for closed fracture with routine healing: Secondary | ICD-10-CM | POA: Diagnosis not present

## 2016-01-01 DIAGNOSIS — M6281 Muscle weakness (generalized): Secondary | ICD-10-CM | POA: Diagnosis not present

## 2016-01-01 DIAGNOSIS — F0280 Dementia in other diseases classified elsewhere without behavioral disturbance: Secondary | ICD-10-CM | POA: Diagnosis not present

## 2016-01-06 DIAGNOSIS — M6281 Muscle weakness (generalized): Secondary | ICD-10-CM | POA: Diagnosis not present

## 2016-01-06 DIAGNOSIS — S72002D Fracture of unspecified part of neck of left femur, subsequent encounter for closed fracture with routine healing: Secondary | ICD-10-CM | POA: Diagnosis not present

## 2016-01-06 DIAGNOSIS — F0280 Dementia in other diseases classified elsewhere without behavioral disturbance: Secondary | ICD-10-CM | POA: Diagnosis not present

## 2016-01-06 DIAGNOSIS — S52512D Displaced fracture of left radial styloid process, subsequent encounter for closed fracture with routine healing: Secondary | ICD-10-CM | POA: Diagnosis not present

## 2016-01-06 DIAGNOSIS — G309 Alzheimer's disease, unspecified: Secondary | ICD-10-CM | POA: Diagnosis not present

## 2016-01-06 DIAGNOSIS — R1311 Dysphagia, oral phase: Secondary | ICD-10-CM | POA: Diagnosis not present

## 2016-01-09 DIAGNOSIS — M6281 Muscle weakness (generalized): Secondary | ICD-10-CM | POA: Diagnosis not present

## 2016-01-09 DIAGNOSIS — S72002D Fracture of unspecified part of neck of left femur, subsequent encounter for closed fracture with routine healing: Secondary | ICD-10-CM | POA: Diagnosis not present

## 2016-01-09 DIAGNOSIS — S52512D Displaced fracture of left radial styloid process, subsequent encounter for closed fracture with routine healing: Secondary | ICD-10-CM | POA: Diagnosis not present

## 2016-01-09 DIAGNOSIS — G309 Alzheimer's disease, unspecified: Secondary | ICD-10-CM | POA: Diagnosis not present

## 2016-01-09 DIAGNOSIS — F0280 Dementia in other diseases classified elsewhere without behavioral disturbance: Secondary | ICD-10-CM | POA: Diagnosis not present

## 2016-01-09 DIAGNOSIS — R1311 Dysphagia, oral phase: Secondary | ICD-10-CM | POA: Diagnosis not present

## 2016-01-13 DIAGNOSIS — F0280 Dementia in other diseases classified elsewhere without behavioral disturbance: Secondary | ICD-10-CM | POA: Diagnosis not present

## 2016-01-13 DIAGNOSIS — M6281 Muscle weakness (generalized): Secondary | ICD-10-CM | POA: Diagnosis not present

## 2016-01-13 DIAGNOSIS — G309 Alzheimer's disease, unspecified: Secondary | ICD-10-CM | POA: Diagnosis not present

## 2016-01-13 DIAGNOSIS — S52512D Displaced fracture of left radial styloid process, subsequent encounter for closed fracture with routine healing: Secondary | ICD-10-CM | POA: Diagnosis not present

## 2016-01-13 DIAGNOSIS — R1311 Dysphagia, oral phase: Secondary | ICD-10-CM | POA: Diagnosis not present

## 2016-01-13 DIAGNOSIS — S72002D Fracture of unspecified part of neck of left femur, subsequent encounter for closed fracture with routine healing: Secondary | ICD-10-CM | POA: Diagnosis not present

## 2016-01-15 DIAGNOSIS — S52512D Displaced fracture of left radial styloid process, subsequent encounter for closed fracture with routine healing: Secondary | ICD-10-CM | POA: Diagnosis not present

## 2016-01-15 DIAGNOSIS — M6281 Muscle weakness (generalized): Secondary | ICD-10-CM | POA: Diagnosis not present

## 2016-01-15 DIAGNOSIS — G309 Alzheimer's disease, unspecified: Secondary | ICD-10-CM | POA: Diagnosis not present

## 2016-01-15 DIAGNOSIS — R1311 Dysphagia, oral phase: Secondary | ICD-10-CM | POA: Diagnosis not present

## 2016-01-15 DIAGNOSIS — S72002D Fracture of unspecified part of neck of left femur, subsequent encounter for closed fracture with routine healing: Secondary | ICD-10-CM | POA: Diagnosis not present

## 2016-01-15 DIAGNOSIS — F0280 Dementia in other diseases classified elsewhere without behavioral disturbance: Secondary | ICD-10-CM | POA: Diagnosis not present

## 2016-01-17 DIAGNOSIS — Z23 Encounter for immunization: Secondary | ICD-10-CM | POA: Diagnosis not present

## 2016-11-28 ENCOUNTER — Encounter (HOSPITAL_COMMUNITY): Payer: Self-pay

## 2016-11-28 ENCOUNTER — Emergency Department (HOSPITAL_COMMUNITY): Payer: Medicare Other

## 2016-11-28 ENCOUNTER — Emergency Department (HOSPITAL_COMMUNITY)
Admission: EM | Admit: 2016-11-28 | Discharge: 2016-11-28 | Disposition: A | Discharge status: Home / Self Care | Payer: Medicare Other | Attending: Emergency Medicine | Admitting: Emergency Medicine

## 2016-11-28 ENCOUNTER — Emergency Department (HOSPITAL_BASED_OUTPATIENT_CLINIC_OR_DEPARTMENT_OTHER): Admit: 2016-11-28 | Discharge: 2016-11-28 | Disposition: A | Discharge status: Home / Self Care | Payer: Medicare Other

## 2016-11-28 DIAGNOSIS — R609 Edema, unspecified: Secondary | ICD-10-CM | POA: Diagnosis not present

## 2016-11-28 DIAGNOSIS — M25552 Pain in left hip: Secondary | ICD-10-CM | POA: Diagnosis not present

## 2016-11-28 DIAGNOSIS — F0390 Unspecified dementia without behavioral disturbance: Secondary | ICD-10-CM | POA: Insufficient documentation

## 2016-11-28 DIAGNOSIS — Z79899 Other long term (current) drug therapy: Secondary | ICD-10-CM | POA: Insufficient documentation

## 2016-11-28 DIAGNOSIS — G8918 Other acute postprocedural pain: Secondary | ICD-10-CM

## 2016-11-28 MED ORDER — TRAMADOL HCL 50 MG PO TABS: 50.0000 mg | ORAL_TABLET | Freq: Four times a day (QID) | ORAL | 0 refills | Status: DC | PRN

## 2016-11-28 MED ORDER — TRAMADOL HCL 50 MG PO TABS
50.0000 mg | ORAL_TABLET | Freq: Once | ORAL | Status: AC
  Administered 2016-11-28: 50 mg via ORAL
  Filled 2016-11-28: qty 1

## 2016-11-28 NOTE — ED Provider Notes (Signed)
[facility] DEPT Provider Note   CSN: [id number] Arrival date & time: 11/28/16  1352     History   Chief Complaint Chief Complaint  Patient presents with  . Leg Pain    HPI Kristen Clark is a 83 y.o. female.  HPI Patient with left hip replacement last year. Daughter states the last month patient has complained of increased left leg pain. States she found the patient in the floor roughly one month ago. No other known trauma. Patient has been able to ambulate with a walker. Daughter noticed that the patient's had increased swelling to the left lower extremity. Patient has history of dementia. 5 caveat applies. Past Medical History:  Diagnosis Date  . Anxiety   . Arthritis    "generalized" (10/01/2015)  . Dementia    "aggressive" (10/01/2015)  . Femoral fracture (HCC) 09/30/2015   Fracture of femoral neck, left after mechanical fall  . Insomnia   . Left radial fracture 09/30/2015   after mechanical fall    Patient Active Problem List   Diagnosis Date Noted  . Constipation 10/13/2015  . Anxiety and depression 10/13/2015  . Vascular dementia without behavioral disturbance 10/08/2015  . Postoperative anemia due to acute blood loss   . Pressure ulcer 10/02/2015  . Hip fracture (HCC) 10/02/2015  . Palliative care encounter   . Goals of care, counseling/discussion   . Left radial fracture 10/01/2015  . Acute hyperglycemia 10/01/2015  . Osteopenia 10/01/2015  . Fracture of femoral neck, left (HCC) 10/01/2015  . Femoral neck fracture, left, closed, initial encounter 10/01/2015  . Severe protein-calorie malnutrition Lily Kocher(Gomez: less than 60% of standard weight) (HCC) 10/01/2015  . Abnormal ECG 10/01/2015  . Vitamin D deficiency 10/01/2015  . Normocytic anemia 10/01/2015    Past Surgical History:  Procedure Laterality Date  . CATARACT EXTRACTION Bilateral early 2000s  . FEMUR IM NAIL Left 10/02/2015   Procedure: INTRAMEDULLARY (IM) NAIL FEMORAL;  Surgeon: Cammy CopaScott Gregory Dean, MD;   Location: [facility] OR;  Service: Orthopedics;  Laterality: Left;  . ORIF WRIST FRACTURE Left 10/02/2015   Procedure: OPEN REDUCTION INTERNAL FIXATION (ORIF) WRIST FRACTURE;  Surgeon: Cammy CopaScott Gregory Dean, MD;  Location: [facility] OR;  Service: Orthopedics;  Laterality: Left;    OB History    No data available       Home Medications    Prior to Admission medications   Medication Sig Start Date End Date Taking? Authorizing Provider  cholecalciferol (VITAMIN D) 1000 units tablet Take 1,000 Units by mouth daily.    [provider]  docusate sodium (COLACE) 100 MG capsule Take 1 capsule (100 mg total) by mouth 2 (two) times daily. 10/03/15   Cammy Copaean, Gregory Scott, MD  donepezil (ARICEPT) 10 MG tablet Take 10 mg by mouth at bedtime.     [provider]  ferrous sulfate 325 (65 FE) MG tablet Take 325 mg by mouth daily with breakfast.    [provider]  guaiFENesin (MUCINEX) 600 MG 12 hr tablet Take 600 mg by mouth 2 (two) times daily.    [provider]  heparin 5000 UNIT/ML injection Inject 1 mL (5,000 Units total) into the skin every 8 (eight) hours. 10/03/15   Cammy Copaean, Gregory Scott, MD  HYDROcodone-acetaminophen (NORCO/VICODIN) 5-325 MG tablet Take 1-2 tablets by mouth every 6 (six) hours as needed for moderate pain. 10/03/15   Cammy Copaean, Gregory Scott, MD  Multiple Vitamins-Minerals (MULTIVITAMIN WITH MINERALS) tablet Take 1 tablet by mouth daily.    [provider]  ondansetron [facility](ZOFRAN)  4 MG tablet Take 1 tablet (4 mg total) by mouth every 6 (six) hours as needed for nausea. 10/07/15   Meredeth Ide, MD  sertraline (ZOLOFT) 25 MG tablet Take 25 mg by mouth daily.    [provider]  thiamine 100 MG tablet Take 100 mg by mouth daily.    [provider]  traMADol (ULTRAM) 50 MG tablet Take 1 tablet (50 mg total) by mouth every 6 (six) hours as needed for moderate pain or severe pain. 11/28/16   Loren Racer, MD  vitamin A 78469 UNIT capsule Take 10,000  Units by mouth daily.    [provider]    Family History History reviewed. No pertinent family history.  Social History Social History  Substance Use Topics  . Smoking status: Never Smoker  . Smokeless tobacco: Never Used  . Alcohol use No     Allergies   Patient has no known allergies.   Review of Systems Review of Systems  Unable to perform ROS: Dementia     Physical Exam Updated Vital Signs BP 115/64   Pulse (!) 102   Temp 98.5 F (36.9 C) (Oral)   Resp 16   Ht  (1.397 m)   Wt 28.1 kg (62 lb)   SpO2 96%   BMI 14.41 kg/m   Physical Exam  Constitutional: She appears well-developed and well-nourished.  Well-nourished appearing  HENT:  Head: Normocephalic and atraumatic.  Mouth/Throat: Oropharynx is clear and moist. No oropharyngeal exudate.  Eyes: Pupils are equal, round, and reactive to light. EOM are normal.  Neck: Normal range of motion. Neck supple.  Cardiovascular: Normal rate and regular rhythm.  Exam reveals no gallop and no friction rub.   No murmur heard. Pulmonary/Chest: Effort normal and breath sounds normal. No respiratory distress. She has no wheezes. She has no rales. She exhibits no tenderness.  Abdominal: Soft. Bowel sounds are normal. There is no tenderness. There is no rebound and no guarding.  Musculoskeletal: Normal range of motion. She exhibits edema.  Patient has full range of motion of the left hip and left ankle. There is some pain with range of motion of left hip. She has 1+ left pretibial pitting edema. No definite calf tenderness. Distal pulses intact.  Neurological: She is alert.  Moving all extremities without focal deficit. Sensation intact. Patient at her baseline status per daughter.  Skin: Skin is warm and dry. Capillary refill takes less than 2 seconds. No rash noted. No erythema.  Psychiatric: She has a normal mood and affect. Her behavior is normal.  Nursing note and vitals reviewed.    ED Treatments /  Results  Labs (all labs ordered are listed, but only abnormal results are displayed) Labs Reviewed - No data to display  EKG  EKG Interpretation None       Radiology Dg Ankle Complete Left  Result Date: 11/28/2016 CLINICAL DATA:  Left ankle pain and swelling EXAM: LEFT ANKLE COMPLETE - 3+ VIEW COMPARISON:  None. FINDINGS: Osteopenia. No acute displaced fracture. Diffuse soft tissue swelling. Soft tissue calcifications. Old appearing deformity of the posterior malleolar region of the tibia. IMPRESSION: Osteopenia with soft tissue swelling. No definite acute osseous abnormality. Electronically Signed   By: Jasmine Pang M.D.   On: 11/28/2016 16:08   Dg Femur 1v Left  Result Date: 11/28/2016 CLINICAL DATA:  Pain and bruising in the left lower extremity EXAM: LEFT FEMUR 1 VIEW COMPARISON:  10/23/2015, 10/02/2015, 10/01/2015 FINDINGS: Single frontal view of the left  femur. Vascular calcifications in the left lower extremity. Patient is status post intramedullary rod fixation of the left femur for previously demonstrated left femoral neck fracture with mild residual deformity and shortening of the neck noted. There appears to be cortical breach of the fixating screws at the femoral head since the prior study. There is suspected lucency around the fixating screws in the femoral heads. No dislocation. IMPRESSION: 1. No acute fracture is seen 2. Old fixated fracture of the left femoral neck with residual shortening and deformity noted. 3. There appears be interval cortical breach of the left femoral head by the fixating screws with suspected lucency around the distal portion of the screws suggesting hardware loosening. Electronically Signed   By: Jasmine Pang M.D.   On: 11/28/2016 16:04    Procedures Procedures (including critical care time)  Medications Ordered in ED Medications  traMADol (ULTRAM) tablet 50 mg (50 mg Oral Given 11/28/16 1931)     Initial Impression / Assessment and Plan / ED  Course  I have reviewed the triage vital signs and the nursing notes.  Pertinent labs & imaging results that were available during my care of the patient were reviewed by me and considered in my medical decision making (see chart for details).     Discussed with Dr. August Saucer who reviewed patient's x-rays. Advised close follow-up in the office next week. Patient was able to ambulate with walker after pain medication. Discussed with family.advise compression stockings and keeping the feet elevated. Will follow-up next week with Dr. August Saucer. Return precautions given.  Final Clinical Impressions(s) / ED Diagnoses   Final diagnoses:  Acute postoperative pain of left hip  Peripheral edema    New Prescriptions Discharge Medication List as of 11/28/2016  9:07 PM    START taking these medications   Details  traMADol (ULTRAM) 50 MG tablet Take 1 tablet (50 mg total) by mouth every 6 (six) hours as needed for moderate pain or severe pain., Starting Sat 11/28/2016, Print         Loren Racer, MD 11/29/16 [phone]

## 2016-11-28 NOTE — Progress Notes (Signed)
VASCULAR LAB PRELIMINARY  PRELIMINARY  PRELIMINARY  PRELIMINARY  Left lower extremity venous duplex completed.    Preliminary report:  There is no DVT or SVT noted in the left lower extremity.   Linnie Mcglocklin, RVT 11/28/2016, 6:34 PM

## 2016-11-28 NOTE — ED Notes (Signed)
Pt stable, ambulatory, states understanding of discharge instructions 

## 2016-11-28 NOTE — Discharge Instructions (Signed)
Follow-up with Dr. August Saucerean early next week. May wear compression stockings to help prevent leg swelling. Keep legs elevated as able.

## 2016-11-28 NOTE — ED Triage Notes (Signed)
Per Family, pt is coming from home with complaints of left leg pain with bruising to the femur. Pt reports some swelling to the left ankle and pain with ambulation or weight.

## 2016-11-30 ENCOUNTER — Encounter (INDEPENDENT_AMBULATORY_CARE_PROVIDER_SITE_OTHER): Payer: Self-pay | Admitting: Orthopedic Surgery

## 2016-11-30 ENCOUNTER — Ambulatory Visit (INDEPENDENT_AMBULATORY_CARE_PROVIDER_SITE_OTHER): Payer: Medicare Other | Admitting: Orthopedic Surgery

## 2016-11-30 DIAGNOSIS — M25552 Pain in left hip: Secondary | ICD-10-CM | POA: Diagnosis not present

## 2016-11-30 MED ORDER — TRAMADOL HCL 50 MG PO TABS: 50.0000 mg | ORAL_TABLET | Freq: Two times a day (BID) | ORAL | 0 refills | Status: AC | PRN

## 2016-12-03 ENCOUNTER — Ambulatory Visit (INDEPENDENT_AMBULATORY_CARE_PROVIDER_SITE_OTHER): Payer: Medicare Other | Admitting: Orthopedic Surgery

## 2016-12-03 NOTE — Progress Notes (Signed)
Office Visit Note   Patient: Kristen Clark           Date of Birth: 07/06/1929           MRN: [id number] Visit Date: 11/30/2016 Requested by: Alysia PennaHolwerda, Scott, MD [address] [place], [place] [id number] PCP: Alysia PennaHolwerda, Scott, MD  Subjective: Chief Complaint  Patient presents with  . Left Leg - Pain    HPI: , Patient is an [age]year-old female with left leg pain.  She had intramedullary hip screw for fracture last year.  She reports that she is having difficulty weightbearing.  She was in the emergency room on Saturday.  She states the pain is getting worse.  She is taking Ultram for pain.  She is here with her granddaughter.              ROS: All systems reviewed are negative as they relate to the chief complaint within the history of present illness.  Patient denies  fevers or chills.   Assessment & Plan: Visit Diagnoses: No diagnosis found.  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  Plan is that we discussed multiple options for trauma.  She is very frail and may not stand the stress of surgery very well.  Options include observation.  Second option would be hardware removal and Girdlestone procedure which would give her leg length discrepancy.  Third option would be harder removal and some type of replacement.  Her bone quality is exceptionally poor and that would likely only lead to further complication.  I think if any surgical intervention was to be considered I would do hardware removal and femoral head removal.  She will consider her options but I think her best option is just to live with it for now and to diminish her weightbearing activity.  Her bone quality looks exceedingly poor for any type of operative intervention  Follow-Up Instructions: No Follow-up on file.   Orders:  No orders of the defined types were placed in this encounter.  Meds ordered this encounter  Medications  . traMADol  (ULTRAM) 50 MG tablet    Sig: Take 1 tablet (50 mg total) by mouth every 12 (twelve) hours as needed for moderate pain or severe pain.    Dispense:  90 tablet    Refill:  0      Procedures: No procedures performed   Clinical Data: No additional findings.  Objective: Vital Signs: There were no vitals taken for this visit.  Physical Exam:   Constitutional: Patient appears well-developed HEENT:  Head: Normocephalic Eyes:EOM are normal Neck: Normal range of motion Cardiovascular: Normal rate Pulmonary/chest: Effort normal Neurologic: Patient is alert Skin: Skin is warm Psychiatric: Patient has normal mood and affect    Ortho Exam: orthopedic exam demonstrates some shortening of the left lower extremity.  She does have about half of her arc of motion with internal and external rotation left leg versus right.  Hip flexion strength is intact.  No other masses lymph adenopathy or skin changes noted in the hip region.  Patient is thin bordering on cachectic  Specialty Comments:  No specialty comments available.  Imaging: No results found.   PMFS History: Patient Active Problem List   Diagnosis Date Noted  . Constipation 10/13/2015  . Anxiety and depression 10/13/2015  . Vascular dementia without behavioral disturbance 10/08/2015  . Postoperative anemia due to acute blood loss   . Pressure ulcer 10/02/2015  .  Hip fracture (HCC) 10/02/2015  . Palliative care encounter   . Goals of care, counseling/discussion   . Left radial fracture 10/01/2015  . Acute hyperglycemia 10/01/2015  . Osteopenia 10/01/2015  . Fracture of femoral neck, left (HCC) 10/01/2015  . Femoral neck fracture, left, closed, initial encounter 10/01/2015  . Severe protein-calorie malnutrition Lily Kocher: less than 60% of standard weight) (HCC) 10/01/2015  . Abnormal ECG 10/01/2015  . Vitamin D deficiency 10/01/2015  . Normocytic anemia 10/01/2015   Past Medical History:  Diagnosis Date  . Anxiety   .  Arthritis    "generalized" (10/01/2015)  . Dementia    "aggressive" (10/01/2015)  . Femoral fracture (HCC) 09/30/2015   Fracture of femoral neck, left after mechanical fall  . Insomnia   . Left radial fracture 09/30/2015   after mechanical fall    No family history on file.  Past Surgical History:  Procedure Laterality Date  . CATARACT EXTRACTION Bilateral early 2000s  . FEMUR IM NAIL Left 10/02/2015   Procedure: INTRAMEDULLARY (IM) NAIL FEMORAL;  Surgeon: Cammy Copa, MD;  Location: [facility] OR;  Service: Orthopedics;  Laterality: Left;  . ORIF WRIST FRACTURE Left 10/02/2015   Procedure: OPEN REDUCTION INTERNAL FIXATION (ORIF) WRIST FRACTURE;  Surgeon: Cammy Copa, MD;  Location: [facility] OR;  Service: Orthopedics;  Laterality: Left;   Social History   Occupational History  . Not on file.   Social History Main Topics  . Smoking status: Never Smoker  . Smokeless tobacco: Never Used  . Alcohol use No  . Drug use: No  . Sexual activity: No

## 2017-01-25 IMAGING — CR DG WRIST COMPLETE 3+V*L*
4 series · 4 of 4 positions shown · non-contrast
Comparison: None.

CLINICAL DATA: Status post fall a few days ago with a left wrist
injury. Pain and swelling. Initial encounter.

EXAM:
LEFT WRIST - COMPLETE 3+ VIEW

[wrist pa]
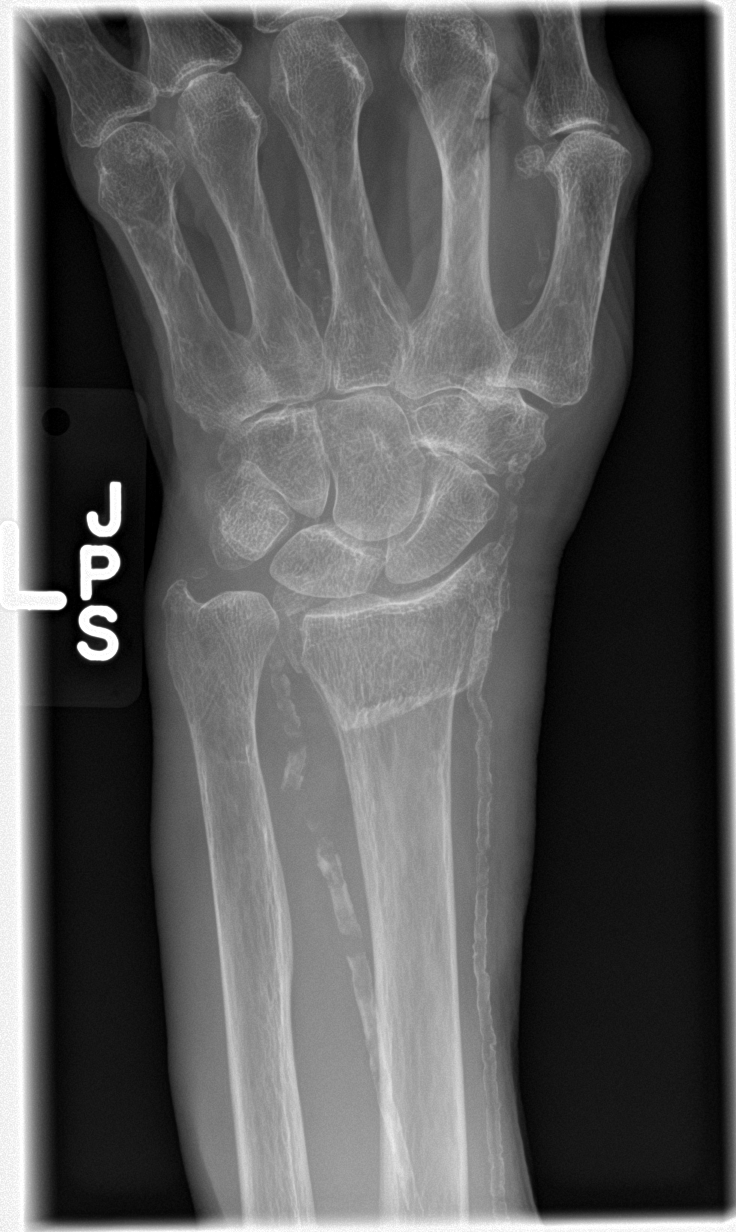

[wrist obl]
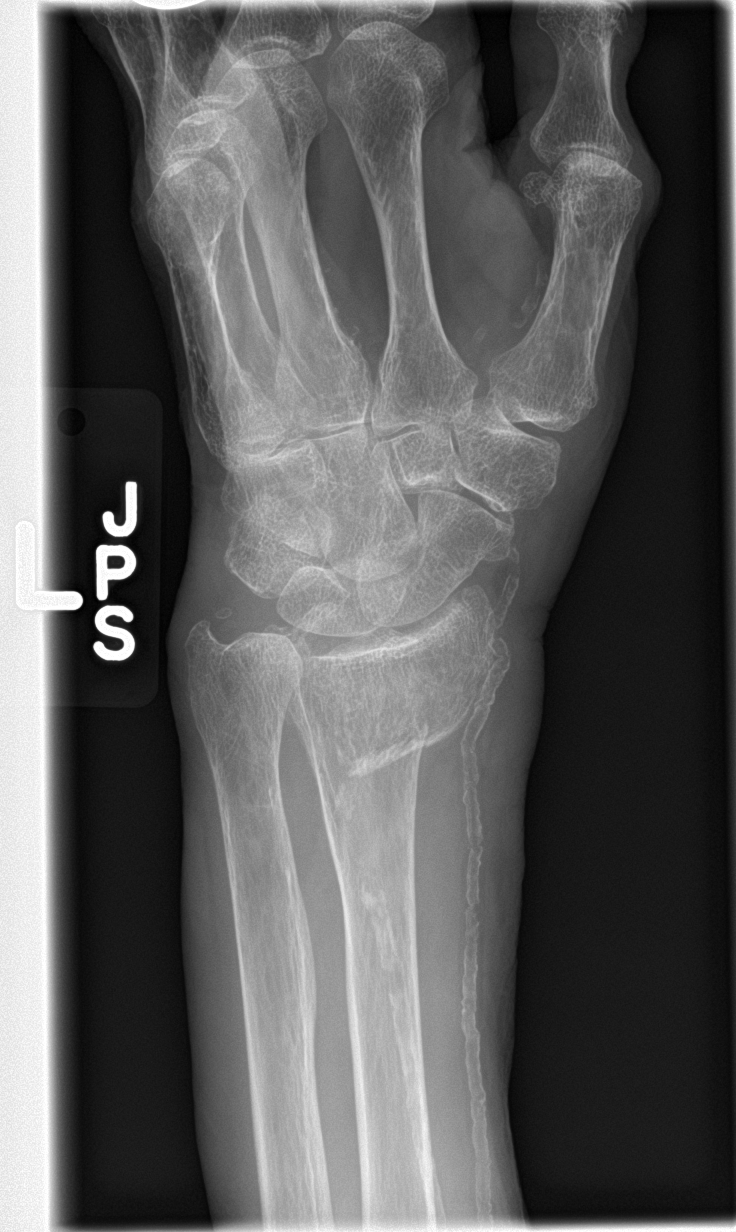

[wrist lat]
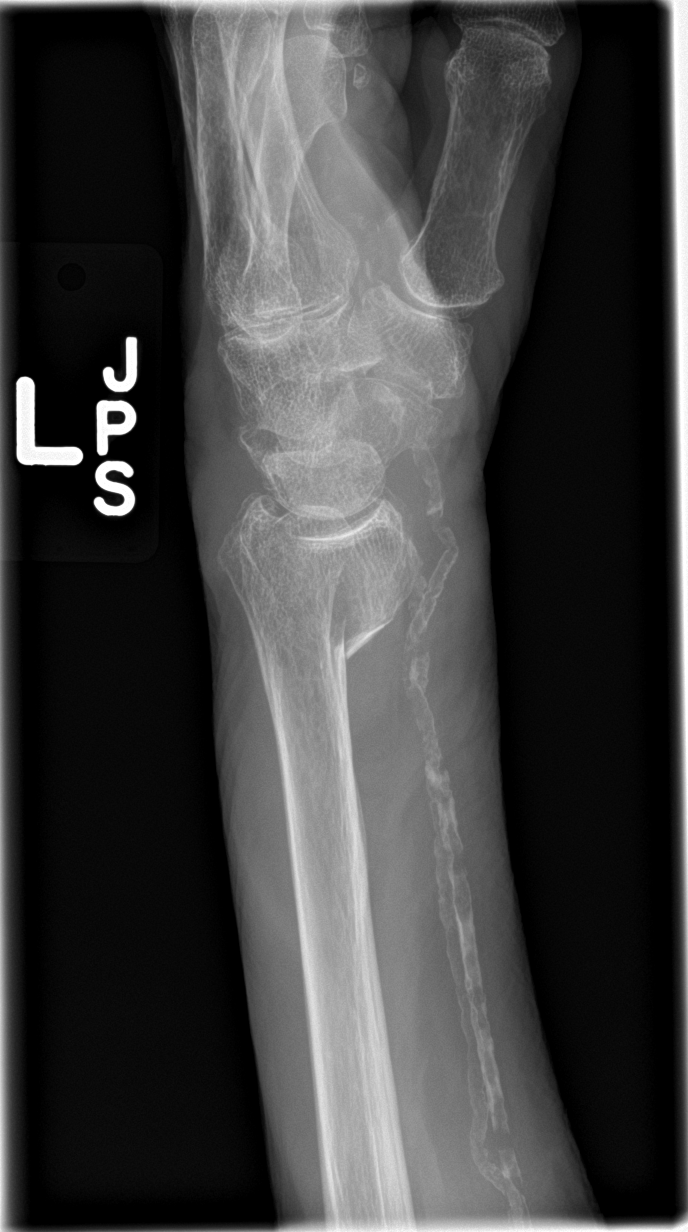

[wrist navicular]
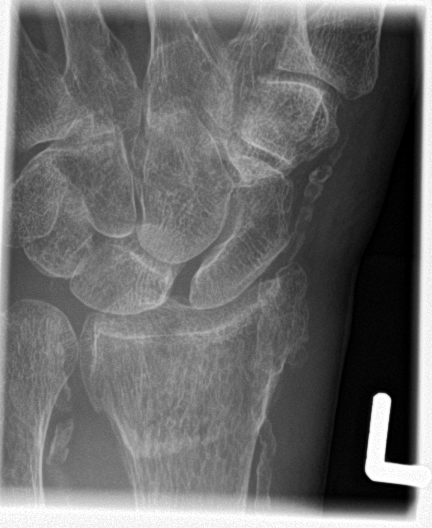

[4 of 4 positions shown; findings below may reference images not displayed]

FINDINGS: The patient has a fracture of the distal radius with intra-articular
involvement. There is impaction of the volar aspect of the radius
above 0.6 cm. The fracture appears mildly comminuted. No other acute
bony or joint abnormality is seen. Bones are osteopenic.
Atherosclerosis is noted. Soft tissue swelling is present about the
wrist. First CMC and scaphoid trapezium trapezoid joint
osteoarthritis is noted.
IMPRESSION: Impacted distal radius fracture involves articular surface.

Osteopenia.

Atherosclerosis.

## 2018-09-21 DEATH — deceased
